# Patient Record
Sex: Male | Born: 1965 | Hispanic: Yes | Marital: Married | Smoking: Current some day smoker
Health system: Southern US, Community
[De-identification: ages and names within clinical notes are randomized; demographics above are authoritative.]

## PROBLEM LIST (undated history)

## (undated) DIAGNOSIS — K4090 Unilateral inguinal hernia, without obstruction or gangrene, not specified as recurrent: Secondary | ICD-10-CM

## (undated) HISTORY — PX: HERNIA REPAIR: SHX51

---

## 1999-02-22 HISTORY — PX: HERNIA REPAIR: SHX51

## 2010-07-21 ENCOUNTER — Emergency Department: Payer: Self-pay | Admitting: Unknown Physician Specialty

## 2014-08-22 ENCOUNTER — Emergency Department
Admission: EM | Admit: 2014-08-22 | Discharge: 2014-08-22 | Disposition: A | Discharge status: Home / Self Care | Payer: Self-pay | Attending: Emergency Medicine | Admitting: Emergency Medicine

## 2014-08-22 DIAGNOSIS — M542 Cervicalgia: Secondary | ICD-10-CM | POA: Insufficient documentation

## 2014-08-22 DIAGNOSIS — M545 Low back pain, unspecified: Secondary | ICD-10-CM

## 2014-08-22 MED ORDER — KETOROLAC TROMETHAMINE 60 MG/2ML IM SOLN
60.0000 mg | Freq: Once | INTRAMUSCULAR | Status: AC
  Administered 2014-08-22: 60 mg via INTRAMUSCULAR

## 2014-08-22 MED ORDER — DIAZEPAM 2 MG PO TABS: 2.0000 mg | ORAL_TABLET | Freq: Three times a day (TID) | ORAL | Status: AC | PRN

## 2014-08-22 MED ORDER — DIAZEPAM 2 MG PO TABS
2.0000 mg | ORAL_TABLET | Freq: Once | ORAL | Status: AC
  Administered 2014-08-22: 2 mg via ORAL

## 2014-08-22 MED ORDER — KETOROLAC TROMETHAMINE 10 MG PO TABS: 10.0000 mg | ORAL_TABLET | Freq: Four times a day (QID) | ORAL | Status: DC | PRN

## 2014-08-22 MED ORDER — KETOROLAC TROMETHAMINE 60 MG/2ML IM SOLN
INTRAMUSCULAR | Status: AC
  Administered 2014-08-22: 60 mg via INTRAMUSCULAR
  Filled 2014-08-22: qty 2

## 2014-08-22 MED ORDER — DIAZEPAM 2 MG PO TABS
ORAL_TABLET | ORAL | Status: AC
  Administered 2014-08-22: 2 mg via ORAL
  Filled 2014-08-22: qty 1

## 2014-08-22 NOTE — ED Notes (Signed)
Pt states right sided neck pain that began yesterday after waking from sleep. Pt states also has low back pain. Pt able to move neck without difficulty. Pt denies denies vomiting or fever. Pt without rash or nuchal rigidity.

## 2014-08-22 NOTE — ED Provider Notes (Signed)
[facility] Emergency Department Provider Note ____________________________________________  Time seen: Approximately 11:46 PM  I have reviewed the triage vital signs and the nursing notes.   HISTORY  Chief Complaint Neck Pain   HPI Clinton Cole is a 49 y.o. male who presents today with right sided neck pain and lower back pain that started upon awakening. He has not taken any medications to relieve the pain. He has never had this type of pain in the past. He works as a Location manager and wonders if he pulled something while on the job, but denies specific injury.  No past medical history on file.  There are no active problems to display for this patient.   No past surgical history on file.  Current Outpatient Rx  Name  Route  Sig  Dispense  Refill  . diazepam (VALIUM) 2 MG tablet   Oral   Take 1 tablet (2 mg total) by mouth every 8 (eight) hours as needed for anxiety.   30 tablet   0   . ketorolac (TORADOL) 10 MG tablet   Oral   Take 1 tablet (10 mg total) by mouth every 6 (six) hours as needed.   20 tablet   0     Allergies Review of patient's allergies indicates no known allergies.  No family history on file.  Social History History  Substance Use Topics  . Smoking status: Not on file  . Smokeless tobacco: Not on file  . Alcohol Use: Not on file    Review of Systems Constitutional: No recent illness. Eyes: No visual changes. ENT: No sore throat. Cardiovascular: Denies chest pain or palpitations. Respiratory: Denies shortness of breath. Gastrointestinal: No abdominal pain.  Genitourinary: Negative for dysuria. Musculoskeletal: Pain in right lateral neck and right lumbar area. Skin: Negative for rash. Neurological: Negative for headaches, focal weakness or numbness. 10-point ROS otherwise negative.  ____________________________________________   PHYSICAL EXAM:  VITAL SIGNS: ED Triage Vitals  Enc Vitals Group      BP 08/22/14 1914 140/83 mmHg     Pulse Rate 08/22/14 1914 85     Resp 08/22/14 1914 16     Temp 08/22/14 1914 98.4 F (36.9 C)     Temp Source 08/22/14 1914 Oral     SpO2 08/22/14 1914 100 %     Weight 08/22/14 1914 162 lb (73.483 kg)     Height 08/22/14 1914  (1.727 m)     Head Cir --      Peak Flow --      Pain Score 08/22/14 1915 8     Pain Loc --      Pain Edu? --      Excl. in GC? --     Constitutional: Alert and oriented. Well appearing and in no acute distress. Eyes: Conjunctivae are normal. EOMI. Head: Atraumatic. Nose: No congestion/rhinnorhea. Neck: No stridor.  Respiratory: Normal respiratory effort.   Musculoskeletal: Tenderness to palpation over the lateral neck and into the trapezius muscles on the right side. Tenderness to palpation of the right lower back. No midline tenderness. Full ROM of neck and back.  Neurologic:  Normal speech and language. No gross focal neurologic deficits are appreciated. Speech is normal. No gait instability. Skin:  Skin is warm, dry and intact. Atraumatic. Psychiatric: Mood and affect are normal. Speech and behavior are normal.  ____________________________________________   LABS (all labs ordered are listed, but only abnormal results are displayed)  Labs Reviewed - No data to display  ____________________________________________  RADIOLOGY  Not indicated ____________________________________________   PROCEDURES  Procedure(s) performed: None   ____________________________________________   INITIAL IMPRESSION / ASSESSMENT AND PLAN / ED COURSE  Pertinent labs & imaging results that were available during my care of the patient were reviewed by me and considered in my medical decision making (see chart for details).  IM toradol and PO valium given in the ER with partial relief of pain. Advised to follow up with PCP or return to the ER for symptoms that change or worsen.   ____________________________________________   FINAL CLINICAL IMPRESSION(S) / ED DIAGNOSES  Final diagnoses:  Musculoskeletal neck pain  Right-sided low back pain without sciatica       Chinita PesterCari B Aseneth Hack, FNP 08/22/14 2350  Loleta Roseory Forbach, MD 08/23/14 [id number]

## 2014-09-30 ENCOUNTER — Encounter: Payer: Self-pay | Admitting: Emergency Medicine

## 2014-09-30 ENCOUNTER — Emergency Department
Admission: EM | Admit: 2014-09-30 | Discharge: 2014-09-30 | Discharge status: Against Medical Advice | Payer: Self-pay | Attending: Emergency Medicine | Admitting: Emergency Medicine

## 2014-09-30 DIAGNOSIS — K469 Unspecified abdominal hernia without obstruction or gangrene: Secondary | ICD-10-CM | POA: Insufficient documentation

## 2014-09-30 DIAGNOSIS — Z720 Tobacco use: Secondary | ICD-10-CM | POA: Insufficient documentation

## 2014-09-30 NOTE — ED Notes (Signed)
Pt stepped to desk and states they have to leave and take his wife to work.  Pt states he may come back later today if he needs to.

## 2014-09-30 NOTE — ED Notes (Signed)
Per interpreter: Pt c/o left groin pain after lifting heavy objects at work. Pt with hx of hernia on left side.

## 2015-05-18 ENCOUNTER — Encounter: Payer: Self-pay | Admitting: Medical Oncology

## 2015-05-18 ENCOUNTER — Emergency Department
Admission: EM | Admit: 2015-05-18 | Discharge: 2015-05-18 | Disposition: A | Discharge status: Home / Self Care | Payer: Self-pay | Attending: Emergency Medicine | Admitting: Emergency Medicine

## 2015-05-18 DIAGNOSIS — R52 Pain, unspecified: Secondary | ICD-10-CM | POA: Insufficient documentation

## 2015-05-18 DIAGNOSIS — Z79899 Other long term (current) drug therapy: Secondary | ICD-10-CM | POA: Insufficient documentation

## 2015-05-18 DIAGNOSIS — J069 Acute upper respiratory infection, unspecified: Secondary | ICD-10-CM

## 2015-05-18 DIAGNOSIS — F17200 Nicotine dependence, unspecified, uncomplicated: Secondary | ICD-10-CM | POA: Insufficient documentation

## 2015-05-18 LAB — RAPID INFLUENZA A&B ANTIGENS: Influenza A (ARMC): NEGATIVE

## 2015-05-18 LAB — RAPID INFLUENZA A&B ANTIGENS (ARMC ONLY): INFLUENZA B (ARMC): NEGATIVE

## 2015-05-18 MED ORDER — GUAIFENESIN-CODEINE 100-10 MG/5ML PO SOLN: 5.0000 mL | ORAL | Status: DC | PRN

## 2015-05-18 MED ORDER — ACETAMINOPHEN 500 MG PO TABS
1000.0000 mg | ORAL_TABLET | Freq: Once | ORAL | Status: AC
  Administered 2015-05-18: 1000 mg via ORAL
  Filled 2015-05-18: qty 2

## 2015-05-18 NOTE — ED Provider Notes (Signed)
[facility] Emergency Department Provider Note  ____________________________________________  Time seen: Approximately 10:58 AM  I have reviewed the triage vital signs and the nursing notes.   HISTORY  Chief Complaint Fever and Generalized Body Aches   HPI Clinton Cole is a 50 y.o. male is here with complaint of fever, headache, body aches for 2-3 days. Patient has taken over-the-counter medication without much relief. He is uncertain as to how much fever he has had a home. He denies any coughing at this time. He denies any nausea, vomiting, diarrhea.Patient currently is complaining of chills. He did not get flu shot this year. Currently he rates his pain as a 9/10.   History reviewed. No pertinent past medical history.  There are no active problems to display for this patient.   Past Surgical History  Procedure Laterality Date  . Hernia repair      Current Outpatient Rx  Name  Route  Sig  Dispense  Refill  . diazepam (VALIUM) 2 MG tablet   Oral   Take 1 tablet (2 mg total) by mouth every 8 (eight) hours as needed for anxiety.   30 tablet   0   . guaiFENesin-codeine 100-10 MG/5ML syrup   Oral   Take 5 mLs by mouth every 4 (four) hours as needed.   120 mL   0   . ketorolac (TORADOL) 10 MG tablet   Oral   Take 1 tablet (10 mg total) by mouth every 6 (six) hours as needed.   20 tablet   0     Allergies Review of patient's allergies indicates no known allergies.  No family history on file.  Social History Social History  Substance Use Topics  . Smoking status: Current Every Day Smoker  . Smokeless tobacco: None  . Alcohol Use: No    Review of Systems Constitutional: Positive fever/chills Eyes: No visual changes. ENT: No sore throat. Cardiovascular: Denies chest pain. Respiratory: Denies shortness of breath.  Gastrointestinal: No abdominal pain.  No nausea, no vomiting.  No diarrhea.  Genitourinary: Negative for  dysuria. Musculoskeletal: Also generalized body aches. Skin: Negative for rash. Neurological: Positive for headaches, no focal weakness or numbness.  10-point ROS otherwise negative.  ____________________________________________   PHYSICAL EXAM:  VITAL SIGNS: ED Triage Vitals  Enc Vitals Group     BP 05/18/15 1032 122/71 mmHg     Pulse Rate 05/18/15 1032 100     Resp 05/18/15 1032 18     Temp 05/18/15 1032 99.4 F (37.4 C)     Temp Source 05/18/15 1032 Oral     SpO2 05/18/15 1032 98 %     Weight 05/18/15 1032 160 lb (72.576 kg)     Height 05/18/15 1032 5' 4.57" (1.64 m)     Head Cir --      Peak Flow --      Pain Score 05/18/15 1032 9     Pain Loc --      Pain Edu? --      Excl. in GC? --     Constitutional: Alert and oriented. Well appearing and in no acute distress. Eyes: Conjunctivae are normal. PERRL. EOMI. Head: Atraumatic. Nose: No congestion/rhinnorhea.EACs and TMs are clear bilaterally. Mouth/Throat: Mucous membranes are moist.  Oropharynx non-erythematous. Positive posterior drainage. Neck: No stridor.   Hematological/Lymphatic/Immunilogical: No cervical lymphadenopathy. Cardiovascular: Normal rate, regular rhythm. Grossly normal heart sounds.  Good peripheral circulation. Respiratory: Normal respiratory effort.  No retractions. Lungs CTAB. Gastrointestinal: Soft and nontender.  No distention.  Musculoskeletal: Moves upper and lower extremities without any difficulty. Normal gait was noted. Neurologic:  Normal speech and language. No gross focal neurologic deficits are appreciated. No gait instability. Skin:  Skin is warm, dry and intact. No rash noted. Psychiatric: Mood and affect are normal. Speech and behavior are normal.  ____________________________________________   LABS (all labs ordered are listed, but only abnormal results are displayed)  Labs Reviewed  RAPID INFLUENZA A&B ANTIGENS ([facility] ONLY)    PROCEDURES  Procedure(s) performed:  None  Critical Care performed: No  ____________________________________________   INITIAL IMPRESSION / ASSESSMENT AND PLAN / ED COURSE  Pertinent labs & imaging results that were available during my care of the patient were reviewed by me and considered in my medical decision making (see chart for details).  Information was verified with the Spanish interpreter Annice Pih of present. Patient is aware that he is getting a cough syrup with codeine and that his influenza  test is negative. He was given a note for work. He is to increase fluids and take Tylenol or ibuprofen as needed for fever. Follow-up with [facility] if any continued problems. ____________________________________________   FINAL CLINICAL IMPRESSION(S) / ED DIAGNOSES  Final diagnoses:  Acute upper respiratory infection      Tommi Rumps, PA-C 05/18/15 1318  Sharman Cheek, MD 05/18/15 1553

## 2015-05-18 NOTE — Discharge Instructions (Signed)
Infeccin del tracto respiratorio superior, adultos (Upper Respiratory Infection, Adult) La mayora de las infecciones del tracto respiratorio superior estn causadas por un virus. Un infeccin del tracto respiratorio superior afecta la nariz, la garganta y las vas respiratorias superiores. El tipo ms comn de infeccin del tracto respiratorio superior es el resfro comn. CUIDADOS EN EL HOGAR   Tome los medicamentos solamente como se lo haya indicado el mdico.  A fin de aliviar el dolor de garganta, haga grgaras con solucin salina templada o consuma caramelos para la tos, como se lo haya indicado el mdico.  Use un humidificador de vapor clido o inhale el vapor de la ducha para aumentar la humedad del aire. Esto facilitar la respiracin.  Beba suficiente lquido para mantener el pis (orina) claro o de color amarillo plido.  Tome sopas y caldos transparentes.  Siga una dieta saludable.  Descanse todo lo que sea necesario.  Regrese al trabajo cuando la fiebre haya desaparecido o el mdico le diga que puede hacerlo.  Es posible que deba quedarse en su casa durante un tiempo prolongado, para no transmitir la infeccin a los dems.  Tambin puede usar un barbijo y lavarse las manos con frecuencia para evitar el contagio del virus.  Si tiene asma, use el inhalador con mayor frecuencia.  No consuma ningn producto que contenga tabaco, lo que incluye cigarrillos, tabaco de mascar o cigarrillos electrnicos. Si necesita ayuda para dejar de fumar, consulte al mdico. SOLICITE AYUDA SI:  Siente que empeora o que no mejora.  Los medicamentos no logran aliviar los sntomas.  Tiene escalofros.  La dificultad para respirar es peor.  Tiene mucosidad marrn o roja.  Tiene una secrecin amarilla o marrn de la nariz.  Le duele la cara, especialmente al inclinarse hacia adelante.  Tiene fiebre.  Tiene los ganglios del cuello hinchados.  Siente dolor al tragar.  Tiene zonas  blancas en la parte de atrs de la garganta. SOLICITE AYUDA DE INMEDIATO SI:   Los siguientes sntomas son muy intensos o constantes:  Dolor de cabeza.  Dolor de odos.  Dolor en la frente, detrs de los ojos y por encima de los pmulos (dolor sinusal).  Dolor en el pecho.  Tiene enfermedad pulmonar prolongada (crnica) y cualquiera de estos sntomas:  Sibilancias.  Tos prolongada.  Tos con sangre.  Cambio en la mucosidad habitual.  Presenta rigidez en el cuello.  Tiene cambios en:  La visin.  La audicin.  El pensamiento.  El estado de nimo. ASEGRESE DE QUE:   Comprende estas instrucciones.  Controlar su afeccin.  Recibir ayuda de inmediato si no mejora o si empeora.   Esta informacin no tiene como fin reemplazar el consejo del mdico. Asegrese de hacerle al mdico cualquier pregunta que tenga.   Document Released: 07/12/2010 Document Revised: 06/24/2014 Elsevier Interactive Patient Education 2016 Elsevier Inc.  

## 2015-05-18 NOTE — ED Notes (Signed)
Fever headache and body aches since Saturday  No cough

## 2015-05-18 NOTE — ED Notes (Signed)
Pt reports fever and body aches since Saturday.

## 2018-01-30 ENCOUNTER — Emergency Department
Admission: EM | Admit: 2018-01-30 | Discharge: 2018-01-30 | Disposition: A | Discharge status: Home / Self Care | Payer: Worker's Compensation | Attending: Emergency Medicine | Admitting: Emergency Medicine

## 2018-01-30 ENCOUNTER — Other Ambulatory Visit: Payer: Self-pay

## 2018-01-30 ENCOUNTER — Encounter: Payer: Self-pay | Admitting: Emergency Medicine

## 2018-01-30 ENCOUNTER — Emergency Department: Payer: Worker's Compensation

## 2018-01-30 DIAGNOSIS — Y9389 Activity, other specified: Secondary | ICD-10-CM | POA: Insufficient documentation

## 2018-01-30 DIAGNOSIS — W228XXA Striking against or struck by other objects, initial encounter: Secondary | ICD-10-CM | POA: Insufficient documentation

## 2018-01-30 DIAGNOSIS — S0083XA Contusion of other part of head, initial encounter: Secondary | ICD-10-CM | POA: Diagnosis not present

## 2018-01-30 DIAGNOSIS — Y999 Unspecified external cause status: Secondary | ICD-10-CM | POA: Diagnosis not present

## 2018-01-30 DIAGNOSIS — H5712 Ocular pain, left eye: Secondary | ICD-10-CM | POA: Insufficient documentation

## 2018-01-30 DIAGNOSIS — S0990XA Unspecified injury of head, initial encounter: Secondary | ICD-10-CM | POA: Diagnosis present

## 2018-01-30 DIAGNOSIS — F17200 Nicotine dependence, unspecified, uncomplicated: Secondary | ICD-10-CM | POA: Diagnosis not present

## 2018-01-30 DIAGNOSIS — Y92512 Supermarket, store or market as the place of occurrence of the external cause: Secondary | ICD-10-CM | POA: Diagnosis not present

## 2018-01-30 MED ORDER — ACETAMINOPHEN 325 MG PO TABS
650.0000 mg | ORAL_TABLET | Freq: Once | ORAL | Status: AC
  Administered 2018-01-30: 650 mg via ORAL
  Filled 2018-01-30: qty 2

## 2018-01-30 MED ORDER — FLUORESCEIN SODIUM 1 MG OP STRP
1.0000 | ORAL_STRIP | Freq: Once | OPHTHALMIC | Status: AC
  Administered 2018-01-30: 1 via OPHTHALMIC

## 2018-01-30 MED ORDER — TETRACAINE HCL 0.5 % OP SOLN
OPHTHALMIC | Status: AC
  Administered 2018-01-30: 1 [drp] via OPHTHALMIC
  Filled 2018-01-30: qty 4

## 2018-01-30 MED ORDER — EYE WASH OPHTH SOLN
1.0000 [drp] | OPHTHALMIC | Status: DC | PRN
  Administered 2018-01-30: 1 [drp] via OPHTHALMIC

## 2018-01-30 MED ORDER — FLUORESCEIN SODIUM 1 MG OP STRP
ORAL_STRIP | OPHTHALMIC | Status: AC
  Administered 2018-01-30: 1 via OPHTHALMIC
  Filled 2018-01-30: qty 1

## 2018-01-30 MED ORDER — TETRACAINE HCL 0.5 % OP SOLN
1.0000 [drp] | Freq: Once | OPHTHALMIC | Status: AC
  Administered 2018-01-30: 1 [drp] via OPHTHALMIC

## 2018-01-30 MED ORDER — IBUPROFEN 600 MG PO TABS: 600.0000 mg | ORAL_TABLET | Freq: Three times a day (TID) | ORAL | 0 refills | Status: AC | PRN

## 2018-01-30 MED ORDER — EYE WASH OPHTH SOLN
OPHTHALMIC | Status: AC
  Administered 2018-01-30: 1 [drp] via OPHTHALMIC
  Filled 2018-01-30: qty 118

## 2018-01-30 NOTE — ED Triage Notes (Signed)
Patient ambulatory to triage with steady gait, without difficulty or distress noted; pt employed with [employer] accomp by supervisor; reports bent over to pick pots up off the floor and hit head on metal chute; c/o HA and difficulty seeing out of left eye; denies LOC; area of redness noted to left side forehead

## 2018-01-30 NOTE — ED Notes (Addendum)
Workmans comp UDS completed with the assistance of interpreter services.

## 2018-01-30 NOTE — Discharge Instructions (Signed)
Follow-up with Dr. Sharman CrateBrassington if any continued problems with your left eye. Take ibuprofen every 8 hours with food if needed for headache or facial pain.  You may use ice to your face as needed for discomfort.  Take your work note with you to work.

## 2018-01-30 NOTE — ED Notes (Signed)
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

## 2018-01-30 NOTE — ED Notes (Signed)
Visual Acuity: Bilateral 20/40 Right eye 20/40 Left eye 20/50

## 2018-01-30 NOTE — ED Provider Notes (Signed)
[facility] Emergency Department Provider Note   ____________________________________________   First MD Initiated Contact with Patient 01/30/18 [id number]     (approximate)  I have reviewed the triage vital signs and the nursing notes.   HISTORY Patient and VIR  Chief Complaint Head Injury   HPI Clinton Cole is a [age] y.o. male presents to the ED with complaint of hitting his left forehead on a metal shoot while at work at [employer].  Patient was brought to the ED by his supervisor.  He reports that he was bending over to pick up pots off the floor when he hit his head on the metal shoot.  No history of loss of consciousness.  Patient complains of a headache and difficulty seeing out of his left eye.  He rates his pain as 9/10.   History reviewed. No pertinent past medical history.  There are no active problems to display for this patient.   Past Surgical History:  Procedure Laterality Date  . HERNIA REPAIR      Prior to Admission medications   Medication Sig Start Date End Date Taking? Authorizing Provider  ibuprofen (ADVIL,MOTRIN) 600 MG tablet Take 1 tablet (600 mg total) by mouth every 8 (eight) hours as needed. 01/30/18   Tommi RumpsSummers, Rhonda L, PA-C    Allergies Patient has no known allergies.  No family history on file.  Social History Social History   Tobacco Use  . Smoking status: Current Every Day Smoker  . Smokeless tobacco: Never Used  Substance Use Topics  . Alcohol use: No  . Drug use: Not on file    Review of Systems Constitutional: No fever/chills Eyes: Left eye pain. ENT: No sore throat. Cardiovascular: Denies chest pain. Respiratory: Denies shortness of breath. Gastrointestinal: No abdominal pain.  No nausea, no vomiting. Musculoskeletal: Negative for muscle skeletal pain. Skin: Positive for erythema left forehead. Neurological: Negative for headaches, focal weakness or  numbness. ___________________________________________   PHYSICAL EXAM:  VITAL SIGNS: ED Triage Vitals  Enc Vitals Group     BP 01/30/18 0108 (!) 141/88     Pulse Rate 01/30/18 0108 76     Resp 01/30/18 0108 16     Temp 01/30/18 0108 97.9 F (36.6 C)     Temp Source 01/30/18 0108 Oral     SpO2 01/30/18 0108 97 %     Weight 01/30/18 0106 153 lb (69.4 kg)     Height 01/30/18 0106 5' 8.11" (1.73 m)     Head Circumference --      Peak Flow --      Pain Score 01/30/18 0106 9     Pain Loc --      Pain Edu? --      Excl. in GC? --    Constitutional: Alert and oriented. Well appearing and in no acute distress. Eyes: Conjunctivae are normal. PERRL. EOMI. tetracaine was placed in the left eye and afterwards there was a exam to look for any foreign bodies.  Lid was everted and no foreign body was present.  Fluorescein stain was placed and no corneal abrasion is noted.  Foreseen was then flushed with eyewash and patient tolerated this well. Head: Atraumatic. Nose: No trauma. Neck: No stridor.  Nontender cervical spine to palpation posteriorly. Cardiovascular: Normal rate, regular rhythm. Grossly normal heart sounds.  Good peripheral circulation. Respiratory: Normal respiratory effort.  No retractions. Lungs CTAB. Musculoskeletal: Moves upper and lower extremities without any difficulty.  Normal gait was noted. Neurologic:  Normal speech and language. No gross focal neurologic deficits are appreciated.  Cranial nerves II through XII grossly intact.  No gait instability. Skin:  Skin is warm, dry and intact.  There is an erythematous area that is localized to the left forehead just above the left eyebrow however skin is intact and there is no evidence of bleeding.  Soft tissue tenderness to palpation. Psychiatric: Mood and affect are normal. Speech and behavior are normal.  ____________________________________________   LABS (all labs ordered are listed, but only abnormal results are  displayed)  Labs Reviewed - No data to display  RADIOLOGY  Official radiology report(s): Ct Head Wo Contrast  Result Date: 01/30/2018 CLINICAL DATA:  Headache and left visual disturbance after hitting his head on a metal chute. Left forehead redness. EXAM: CT HEAD WITHOUT CONTRAST TECHNIQUE: Contiguous axial images were obtained from the base of the skull through the vertex without intravenous contrast. COMPARISON:  None. FINDINGS: Brain: CSF filled cleft in the right frontal lobe extending to the frontal horn of the left lateral ventricle with some tenting of the ventricle toward the cleft. There's also a patchy low density in the adjacent right frontal white matter. The ventricles are normal in size. No intracranial hemorrhage, mass lesion or CT evidence of acute infarction. Vascular: No hyperdense vessel or unexpected calcification. Skull: Normal. Negative for fracture or focal lesion. Sinuses/Orbits: Mild bilateral ethmoid, inferior frontal and anterior sphenoid sinus mucosal thickening. Other: None. IMPRESSION: 1. No acute abnormality. 2. 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 3. Mild chronic bilateral ethmoid, inferior frontal and anterior sphenoid sinusitis. Electronically Signed   By: Beckie Salts M.D.   On: 01/30/2018 01:54  ____________________________________________   PROCEDURES  Procedure(s) performed: None  Procedures  Critical Care performed: No  ____________________________________________   INITIAL IMPRESSION / ASSESSMENT AND PLAN / ED COURSE  As part of my medical decision making, I reviewed the following data within the electronic MEDICAL RECORD NUMBER Notes from prior ED visits and [place] Controlled Substance Database  Patient presents to the ED for a Workmen's Comp. injury in which he hit his forehead on a metal shoot.  There was no loss of consciousness and patient has continued to be ambulatory without any  assistance.  He does complain of a headache and also difficulty seeing out of his left eye.  Visual acuity was checked and documented.  Physical exam showed soft tissue injury above the left eye on the forehead but no open lesions.  CT scan confirmed no acute changes and patient was made aware through the VIR.  It was discussed that Workmen's Comp. 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  Patient was discharged with prescription for ibuprofen 600 mg every 8 hours with food as needed.  ____________________________________________   FINAL CLINICAL IMPRESSION(S) / ED DIAGNOSES  Final diagnoses:  Contusion of forehead, initial encounter  Eye pain, left     ED Discharge Orders         Ordered    ibuprofen (ADVIL,MOTRIN) 600 MG tablet  Every 8 hours PRN     01/30/18 0830           Note:  This document was prepared using Dragon voice recognition software and may include unintentional dictation errors.    Tommi Rumps, PA-C 01/30/18 1447    Emily Filbert, MD 01/30/18 [phone]

## 2018-10-13 ENCOUNTER — Emergency Department: Payer: Self-pay

## 2018-10-13 ENCOUNTER — Other Ambulatory Visit: Payer: Self-pay

## 2018-10-13 ENCOUNTER — Emergency Department
Admission: EM | Admit: 2018-10-13 | Discharge: 2018-10-13 | Disposition: A | Discharge status: Home / Self Care | Payer: Self-pay | Attending: Emergency Medicine | Admitting: Emergency Medicine

## 2018-10-13 DIAGNOSIS — K4090 Unilateral inguinal hernia, without obstruction or gangrene, not specified as recurrent: Secondary | ICD-10-CM | POA: Insufficient documentation

## 2018-10-13 DIAGNOSIS — R1909 Other intra-abdominal and pelvic swelling, mass and lump: Secondary | ICD-10-CM

## 2018-10-13 DIAGNOSIS — F17200 Nicotine dependence, unspecified, uncomplicated: Secondary | ICD-10-CM | POA: Insufficient documentation

## 2018-10-13 LAB — CBC
HCT: 48.9 % (ref 39.0–52.0)
Hemoglobin: 16.2 g/dL (ref 13.0–17.0)
MCH: 30.3 pg (ref 26.0–34.0)
MCHC: 33.1 g/dL (ref 30.0–36.0)
MCV: 91.4 fL (ref 80.0–100.0)
Platelets: 256 10*3/uL (ref 150–400)
RBC: 5.35 MIL/uL (ref 4.22–5.81)
RDW: 12.8 % (ref 11.5–15.5)
WBC: 12.9 10*3/uL — ABNORMAL HIGH (ref 4.0–10.5)
nRBC: 0 % (ref 0.0–0.2)

## 2018-10-13 LAB — COMPREHENSIVE METABOLIC PANEL
ALT: 46 U/L — ABNORMAL HIGH (ref 0–44)
AST: 30 U/L (ref 15–41)
Albumin: 3.9 g/dL (ref 3.5–5.0)
Alkaline Phosphatase: 115 U/L (ref 38–126)
Anion gap: 8 (ref 5–15)
BUN: 12 mg/dL (ref 6–20)
CO2: 22 mmol/L (ref 22–32)
Calcium: 8.5 mg/dL — ABNORMAL LOW (ref 8.9–10.3)
Chloride: 107 mmol/L (ref 98–111)
Creatinine, Ser: 0.6 mg/dL — ABNORMAL LOW (ref 0.61–1.24)
GFR calc Af Amer: >60 mL/min (ref >60)
GFR calc non Af Amer: >60 mL/min (ref >60)
Glucose, Bld: 163 mg/dL — ABNORMAL HIGH (ref 70–99)
Potassium: 3.7 mmol/L (ref 3.5–5.1)
Sodium: 137 mmol/L (ref 135–145)
Total Bilirubin: 0.7 mg/dL (ref 0.3–1.2)
Total Protein: 7.2 g/dL (ref 6.5–8.1)

## 2018-10-13 LAB — LIPASE, BLOOD: Lipase: 25 U/L (ref 11–51)

## 2018-10-13 MED ORDER — IOHEXOL 300 MG/ML  SOLN
100.0000 mL | Freq: Once | INTRAMUSCULAR | Status: AC | PRN
  Administered 2018-10-13: 100 mL via INTRAVENOUS
  Filled 2018-10-13: qty 100

## 2018-10-13 MED ORDER — IOHEXOL 240 MG/ML SOLN
50.0000 mL | Freq: Once | INTRAMUSCULAR | Status: AC | PRN
  Administered 2018-10-13: 50 mL via ORAL
  Filled 2018-10-13: qty 50

## 2018-10-13 NOTE — ED Triage Notes (Signed)
Pt presents via POV c/o hernia that is more painful and larger in groin area. Not visualized at this moment. Sent from MD.

## 2018-10-13 NOTE — Discharge Instructions (Addendum)
You were seen today for left inguinal hernia.  CT of the abdomen pelvis does not show strangulation.    We would like for you to follow-up with general surgery as an outpatient.  Please call and schedule an appointment.

## 2018-10-13 NOTE — ED Provider Notes (Signed)
[facility] Emergency Department Provider Note ____________________________________________  Time seen: 1500  I have reviewed the triage vital signs and the nursing notes.  HISTORY  Chief Complaint  Hernia   HPI Clinton Cole is a 53 y.o. male presents to the ER today with complaint of painful left groin mass.  He reports this has been present for 3 years.  He reports it has gotten larger and more painful over time.  He denies fever, chills, nausea, vomiting or body aches.  He denies constipation, diarrhea or blood in his stool.  He denies urinary retention or blood in urine.  He has not taken anything over-the-counter for this.  He has had hernia surgery in the past.  History reviewed. No pertinent past medical history.  There are no active problems to display for this patient.   Past Surgical History:  Procedure Laterality Date  . HERNIA REPAIR      Prior to Admission medications   Medication Sig Start Date End Date Taking? Authorizing Provider  ibuprofen (ADVIL,MOTRIN) 600 MG tablet Take 1 tablet (600 mg total) by mouth every 8 (eight) hours as needed. 01/30/18   Johnn Hai, PA-C    Allergies Patient has no known allergies.  History reviewed. No pertinent family history.  Social History Social History   Tobacco Use  . Smoking status: Current Every Day Smoker  . Smokeless tobacco: Never Used  Substance Use Topics  . Alcohol use: No  . Drug use: Not on file    Review of Systems  Constitutional: Negative for fever, chills or body aches. ECardiovascular: Negative for chest pain or chest tightness. Respiratory: Negative for cough or shortness of breath. Gastrointestinal: Negative for abdominal pain, nausea, vomiting, constipation, diarrhea or blood in his stool. Genitourinary: Negative for urinary retention or blood in the urine.  ____________________________________________  PHYSICAL EXAM:  VITAL SIGNS: ED Triage Vitals   Enc Vitals Group     BP 10/13/18 1252 (!) 142/90     Pulse Rate 10/13/18 1252 91     Resp 10/13/18 1252 14     Temp 10/13/18 1252 98.6 F (37 C)     Temp Source 10/13/18 1252 Oral     SpO2 10/13/18 1252 95 %     Weight 10/13/18 1253 170 lb (77.1 kg)     Height --      Head Circumference --      Peak Flow --      Pain Score --      Pain Loc --      Pain Edu? --      Excl. in [place]? --     Constitutional: Alert and oriented. Well appearing and in no distress. Cardiovascular: Normal rate, regular rhythm.  Respiratory: Normal respiratory effort. No wheezes/rales/rhonchi. Gastrointestinal: Soft and nontender.  Active bowel sounds.  No distention.  Neurologic:  Normal speech and language.  Skin: Large direct inguinal hernia noted in the left groin.  Unable to be reduced.  ____________________________________________   LABS   Recent Results (from the past 2160 hour(s))  Lipase, blood     Status: None   Collection Time: 10/13/18  1:02 PM  Result Value Ref Range   Lipase 25 11 - 51 U/L    Comment: Performed at [facility], [address] [place], [place] [address]  Comprehensive metabolic panel     Status: Abnormal   Collection Time: 10/13/18  1:02 PM  Result Value Ref Range   Sodium 137 135 - 145  mmol/L   Potassium 3.7 3.5 - 5.1 mmol/L   Chloride 107 98 - 111 mmol/L   CO2 22 22 - 32 mmol/L   Glucose, Bld 163 (H) 70 - 99 mg/dL   BUN 12 6 - 20 mg/dL   Creatinine, Ser 4.780.60 (L) 0.61 - 1.24 mg/dL   Calcium 8.5 (L) 8.9 - 10.3 mg/dL   Total Protein 7.2 6.5 - 8.1 g/dL   Albumin 3.9 3.5 - 5.0 g/dL   AST 30 15 - 41 U/L   ALT 46 (H) 0 - 44 U/L   Alkaline Phosphatase 115 38 - 126 U/L   Total Bilirubin 0.7 0.3 - 1.2 mg/dL   GFR calc non Af Amer >60 >60 mL/min   GFR calc Af Amer >60 >60 mL/min   Anion gap 8 5 - 15    Comment: Performed at [facility], [address] [place], [place] [address]  CBC     Status: Abnormal   Collection Time: 10/13/18  1:02 PM   Result Value Ref Range   WBC 12.9 (H) 4.0 - 10.5 K/uL   RBC 5.35 4.22 - 5.81 MIL/uL   Hemoglobin 16.2 13.0 - 17.0 g/dL   HCT 13.048.9 86.539.0 - 78.452.0 %   MCV 91.4 80.0 - 100.0 fL   MCH 30.3 26.0 - 34.0 pg   MCHC 33.1 30.0 - 36.0 g/dL   RDW 69.612.8 29.511.5 - 28.415.5 %   Platelets 256 150 - 400 K/uL   nRBC 0.0 0.0 - 0.2 %    Comment: Performed at [facility], [address] [place], [place] [address]    ____________________________________________ RADIOLOGY   Imaging Orders     US LT LOWER EXTREM LTD SOFT TISSUE NON VASCULAR     CT ABDOMEN PELVIS W CONTRAST IMPRESSION:  Palpable abnormality is favored to reflect a left inguinal hernia  containing bowel and fluid, although poorly evaluated ultrasound.    Consider CT pelvis with contrast for further evaluation, as  clinically warranted.    CT ABDOMEN/PELVIS WITH CONTRAST: IMPRESSION:  Large left inguinal/scrotal hernia containing fat with fluid and  inflammatory stranding, corresponding to the sonographic  abnormality. No associated bowel within the hernia.    Additional tiny fat containing right inguinal hernia.    No evidence of bowel obstruction. Normal appendix.   ____________________________________________  INITIAL IMPRESSION / ASSESSMENT AND PLAN / ED COURSE  Left Inguinal Hernia:  Not strangulated by imaging He did not want to try reduction in the ER Follow up with General Surgery as an outpatient  ____________________________________________  FINAL CLINICAL IMPRESSION(S) / ED DIAGNOSES  Final diagnoses:  Left groin mass  Left inguinal hernia   Nicki Reaperegina Haskell Rihn, NP    Lorre MunroeBaity, Lumen Brinlee W, NP 10/13/18 1810    Dionne BucySiadecki, Sebastian, MD 10/14/18 [phone]

## 2018-10-23 ENCOUNTER — Ambulatory Visit: Payer: Self-pay | Admitting: General Surgery

## 2018-10-23 NOTE — H&P (View-Only) (Signed)
PATIENT PROFILE: Clinton Cole is a 53 y.o. male who presents to the Clinic for consultation at the request of Dr. Marisa SeverinSiadecki for evaluation of left inguinal hernia.  PCP:  None  HISTORY OF PRESENT ILLNESS: Mr. Clinton Cole reports having a left knee hernia since 2 years ago.  He reports of the last week he felt that he got bigger while he was lifting something heavy at work.  Since then he has been painful.  He denies nausea or vomiting.  Pain radiates to the left testicle.  There is no alleviating or aggravating factor.  He went to the ED and they did a CT scan where he was found to be omental fat and no bowel in the hernia.  I personally evaluated the images.  There is no sign of obstruction or concerning for ischemia.   PROBLEM LIST: Recurrent left inguinal hernia, incarcerated  GENERAL REVIEW OF SYSTEMS:   General ROS: negative for - chills, fatigue, fever, weight gain or weight loss Allergy and Immunology ROS: negative for - hives  Hematological and Lymphatic ROS: negative for - bleeding problems or bruising, negative for palpable nodes Endocrine ROS: negative for - heat or cold intolerance, hair changes Respiratory ROS: negative for - cough, shortness of breath or wheezing Cardiovascular ROS: no chest pain or palpitations GI ROS: negative for nausea, vomiting, abdominal pain, diarrhea, constipation Musculoskeletal ROS: negative for - joint swelling or muscle pain Neurological ROS: negative for - confusion, syncope Dermatological ROS: negative for pruritus and rash Psychiatric: negative for anxiety, depression, difficulty sleeping and memory loss  MEDICATIONS: Current Medications        Current Outpatient Medications  Medication Sig Dispense Refill  . ibuprofen (MOTRIN) 200 MG tablet Take by mouth as needed        No current facility-administered medications for this visit.       ALLERGIES: Patient has no known allergies.  PAST MEDICAL HISTORY: History reviewed. No  pertinent past medical history.  PAST SURGICAL HISTORY:      Past Surgical History:  Procedure Laterality Date  . INGUINAL HERNIA REPAIR Left 2003   [place]     FAMILY HISTORY:      Family History  Problem Relation Age of Onset  . No Known Problems Mother   . No Known Problems Father   . No Known Problems Sister   . Diabetes Brother   . Diabetes Brother   . Diabetes Brother   . No Known Problems Brother   . No Known Problems Sister   . No Known Problems Sister      SOCIAL HISTORY: Social History          Socioeconomic History  . Marital status: Unknown    Spouse name: Not on file  . Number of children: Not on file  . Years of education: Not on file  . Highest education level: Not on file  Occupational History  . Not on file  Social Needs  . Financial resource strain: Not on file  . Food insecurity    Worry: Not on file    Inability: Not on file  . Transportation needs    Medical: Not on file    Non-medical: Not on file  Tobacco Use  . Smoking status: Former Games developermoker  . Smokeless tobacco: Never Used  Substance and Sexual Activity  . Alcohol use: Never    Frequency: Never    Comment: quit 2010  . Drug use: Never  . Sexual activity: Not on file  Other Topics Concern  . Not on file  Social History Narrative  . Not on file      PHYSICAL EXAM:    Vitals:   10/23/18 1558  BP: 135/87  Pulse: 97   Body mass index is 25.7 kg/m. Weight: 76.7 kg (169 lb)   GENERAL: Alert, active, oriented x3  HEENT: Pupils equal reactive to light. Extraocular movements are intact. Sclera clear. Palpebral conjunctiva normal red color.Pharynx clear.  NECK: Supple with no palpable mass and no adenopathy.  LUNGS: Sound clear with no rales rhonchi or wheezes.  HEART: Regular rhythm S1 and S2 without murmur.  ABDOMEN: Soft and depressible, nontender with no palpable mass, no hepatomegaly.  Large left inguinal hernia,  incarcerated, moderately tender to palpation.  There is no skin changes.  There is no sign of strangulation.  EXTREMITIES: Well-developed well-nourished symmetrical with no dependent edema.  NEUROLOGICAL: Awake alert oriented, facial expression symmetrical, moving all extremities.  REVIEW OF DATA: I have reviewed the following data today: No results found for any previous visit.     ASSESSMENT: Mr. Clinton Cole is a 53 y.o. male presenting for consultation for left recurrent incarcerated inguinal hernia.    The patient presents with a symptomatic, recurrent, incarcerated omentum inguinal hernia. Patient was oriented about the diagnosis of recurrent inguinal hernia and its implication. The patient was oriented about the treatment alternatives (observation vs surgical repair). Due to patient symptoms, repair is recommended. Patient oriented about the surgical procedure, the use of mesh and its risk of complications such as: infection, bleeding, injury to vas deference, vasculature and testicle, injury to bowel or bladder, and chronic pain.  Since patient has inguinal hernia repairs x2 on the same side I discussed with him the recommendation of starting with laparoscopic approach.  Due to the size and the amount of tissue incarcerated in the hernia I disclosed that it might be difficult to complete the surgery laparoscopically and open approach might be needed.  He understood and agreed to proceed.  Unilateral recurrent inguinal hernia without obstruction or gangrene [K40.91]  PLAN: 1. Laparoscopic vs open recurrent incarcerated inguinal hernia repair with mesh (24097, 35329) 2. Avoid heavy lifting until surgery next week.  3. Do not take aspirin  Patient verbalized understanding, all questions were answered, and were agreeable with the plan outlined above.    Herbert Pun, MD  Electronically signed by Herbert Pun, MD

## 2018-10-23 NOTE — H&P (Signed)
PATIENT PROFILE: Clinton Cole is a 53 y.o. male who presents to the Clinic for consultation at the request of Dr. Siadecki for evaluation of left inguinal hernia.  PCP:  None  HISTORY OF PRESENT ILLNESS: Clinton Cole reports having a left knee hernia since 2 years ago.  He reports of the last week he felt that he got bigger while he was lifting something heavy at work.  Since then he has been painful.  He denies nausea or vomiting.  Pain radiates to the left testicle.  There is no alleviating or aggravating factor.  He went to the ED and they did a CT scan where he was found to be omental fat and no bowel in the hernia.  I personally evaluated the images.  There is no sign of obstruction or concerning for ischemia.   PROBLEM LIST: Recurrent left inguinal hernia, incarcerated  GENERAL REVIEW OF SYSTEMS:   General ROS: negative for - chills, fatigue, fever, weight gain or weight loss Allergy and Immunology ROS: negative for - hives  Hematological and Lymphatic ROS: negative for - bleeding problems or bruising, negative for palpable nodes Endocrine ROS: negative for - heat or cold intolerance, hair changes Respiratory ROS: negative for - cough, shortness of breath or wheezing Cardiovascular ROS: no chest pain or palpitations GI ROS: negative for nausea, vomiting, abdominal pain, diarrhea, constipation Musculoskeletal ROS: negative for - joint swelling or muscle pain Neurological ROS: negative for - confusion, syncope Dermatological ROS: negative for pruritus and rash Psychiatric: negative for anxiety, depression, difficulty sleeping and memory loss  MEDICATIONS: Current Medications        Current Outpatient Medications  Medication Sig Dispense Refill  . ibuprofen (MOTRIN) 200 MG tablet Take by mouth as needed        No current facility-administered medications for this visit.       ALLERGIES: Patient has no known allergies.  PAST MEDICAL HISTORY: History reviewed. No  pertinent past medical history.  PAST SURGICAL HISTORY:      Past Surgical History:  Procedure Laterality Date  . INGUINAL HERNIA REPAIR Left 2003   in [place]     FAMILY HISTORY:      Family History  Problem Relation Age of Onset  . No Known Problems Mother   . No Known Problems Father   . No Known Problems Sister   . Diabetes Brother   . Diabetes Brother   . Diabetes Brother   . No Known Problems Brother   . No Known Problems Sister   . No Known Problems Sister      SOCIAL HISTORY: Social History          Socioeconomic History  . Marital status: Unknown    Spouse name: Not on file  . Number of children: Not on file  . Years of education: Not on file  . Highest education level: Not on file  Occupational History  . Not on file  Social Needs  . Financial resource strain: Not on file  . Food insecurity    Worry: Not on file    Inability: Not on file  . Transportation needs    Medical: Not on file    Non-medical: Not on file  Tobacco Use  . Smoking status: Former Smoker  . Smokeless tobacco: Never Used  Substance and Sexual Activity  . Alcohol use: Never    Frequency: Never    Comment: quit 2010  . Drug use: Never  . Sexual activity: Not on file    Other Topics Concern  . Not on file  Social History Narrative  . Not on file      PHYSICAL EXAM:    Vitals:   10/23/18 1558  BP: 135/87  Pulse: 97   Body mass index is 25.7 kg/m. Weight: 76.7 kg (169 lb)   GENERAL: Alert, active, oriented x3  HEENT: Pupils equal reactive to light. Extraocular movements are intact. Sclera clear. Palpebral conjunctiva normal red color.Pharynx clear.  NECK: Supple with no palpable mass and no adenopathy.  LUNGS: Sound clear with no rales rhonchi or wheezes.  HEART: Regular rhythm S1 and S2 without murmur.  ABDOMEN: Soft and depressible, nontender with no palpable mass, no hepatomegaly.  Large left inguinal hernia,  incarcerated, moderately tender to palpation.  There is no skin changes.  There is no sign of strangulation.  EXTREMITIES: Well-developed well-nourished symmetrical with no dependent edema.  NEUROLOGICAL: Awake alert oriented, facial expression symmetrical, moving all extremities.  REVIEW OF DATA: I have reviewed the following data today: No results found for any previous visit.     ASSESSMENT: [name] is a 53 y.o. male presenting for consultation for left recurrent incarcerated inguinal hernia.    The patient presents with a symptomatic, recurrent, incarcerated omentum inguinal hernia. Patient was oriented about the diagnosis of recurrent inguinal hernia and its implication. The patient was oriented about the treatment alternatives (observation vs surgical repair). Due to patient symptoms, repair is recommended. Patient oriented about the surgical procedure, the use of mesh and its risk of complications such as: infection, bleeding, injury to vas deference, vasculature and testicle, injury to bowel or bladder, and chronic pain.  Since patient has inguinal hernia repairs x2 on the same side I discussed with him the recommendation of starting with laparoscopic approach.  Due to the size and the amount of tissue incarcerated in the hernia I disclosed that it might be difficult to complete the surgery laparoscopically and open approach might be needed.  He understood and agreed to proceed.  Unilateral recurrent inguinal hernia without obstruction or gangrene [K40.91]  PLAN: 1. Laparoscopic vs open recurrent incarcerated inguinal hernia repair with mesh (24097, 35329) 2. Avoid heavy lifting until surgery next week.  3. Do not take aspirin  Patient verbalized understanding, all questions were answered, and were agreeable with the plan outlined above.    Herbert Pun, MD  Electronically signed by Herbert Pun, MD

## 2018-10-25 ENCOUNTER — Other Ambulatory Visit: Payer: Self-pay

## 2018-10-25 ENCOUNTER — Encounter
Admission: RE | Admit: 2018-10-25 | Discharge: 2018-10-25 | Disposition: A | Discharge status: Home / Self Care | Payer: Self-pay | Source: Ambulatory Visit | Attending: General Surgery | Admitting: General Surgery

## 2018-10-25 DIAGNOSIS — Z01812 Encounter for preprocedural laboratory examination: Secondary | ICD-10-CM | POA: Insufficient documentation

## 2018-10-25 HISTORY — DX: Unilateral inguinal hernia, without obstruction or gangrene, not specified as recurrent: K40.90

## 2018-10-25 NOTE — Patient Instructions (Signed)
Your procedure is scheduled on: 10-30-18 TUESDAY Su procedimiento est programado para: Report to [facility] a: To find out your arrival time please call [phone] between 1PM - 3PM on 10-26-18 FRIDAY Para saber su hora de llegada por favor llame al [phone] entre la 1PM - 3PM el da:   Remember: Instructions that are not followed completely may result in serious medical risk, up to and including death,  or upon the discretion of your surgeon and anesthesiologist your surgery may need to be rescheduled.  Recuerde: Las instrucciones que no se siguen completamente Heritage manager en un riesgo de salud grave, incluyendo hasta  la [place] o a discrecin de su cirujano y Environmental health practitioner, su ciruga se puede posponer.   __X_ 1.Do not eat food after midnight the night before your procedure. No    gum chewing or hard candies. You may drink clear liquids up to 2 hours     before you are scheduled to arrive for your surgery- DO not drink clear     Liquids within 2 hours of the start of your surgery.     Clear Liquids include:    water, apple juice without pulp, clear carbohydrate drink such as    Clearfast of Gart[place]ade, Black Coffee or Tea (Do not add anything to coffee or tea).      No coma nada despus de la medianoche de la noche anterior a su    procedimiento. No coma chicles ni caramelos duros. Puede tomar    lquidos claros hasta 2 horas antes de su hora programada de llegada al     hospital para su procedimiento. No tome lquidos claros durante el     transcurso de las 2 horas de su llegada programada al hospital para su     procedimiento, ya que esto puede llevar a que su procedimiento se    retrase o tenga que volver a Health and safety inspector.  Los lquidos claros incluyen:          - Agua o jugo de [place] sin pulpa          - Bebidas claras con carbohidratos como ClearFast o Gatorade          - Caf negro o t claro (sin leche, sin cremas, no  agregue nada al caf ni al t)  No tome nada que no est en esta lista.  Los pacientes con diabetes tipo 1 y tipo 2 solo deben Agricultural engineer.  Llame a la clnica de PreCare o a la unidad de Same Day Surgery si  tiene alguna pregunta sobre estas instrucciones.              _X__ 2.Do Not Smoke or use e-cigarettes For 24 Hours Prior to Your Surgery.    Do not use any chewable tobacco products for at least 6   hours prior to surgery.    No fume ni use cigarrillos electrnicos durante las 24 horas previas    a su [place].  No use ningn producto de tabaco masticable durante   al menos 6 horas antes de la [place].     __X_ 3. No alcohol for 24 hours before or after surgery.    No tome alcohol durante las 24 horas antes ni despus de la [place].   ____4. Bring all medications with you on the day of surgery if instructed.    Lleve todos los medicamentos con usted el da de su ciruga si se le  ha indicado as.   _X___ 5. Notify your doctor if there is any change in your medical condition (cold,fever, infections).    Informe a su mdico si hay algn cambio en su condicin mdica  (resfriado, fiebre, infecciones).   Do not wear jewelry, make-up, hairpins, clips or nail polish.  No use joyas, maquillajes, pinzas/ganchos para el cabello ni esmalte de uas.  Do not wear lotions, powders, or perfumes.   No use lociones, polvos o perfumes.      Do not shave 48 hours prior to surgery. Men may shave face and neck.  No se afeite 48 horas antes de la [place].  Los hombres pueden [employer] cara  y el cuello.   Do not bring valuables to the hospital.   No lleve objetos de valor al hospital.  [facility] is not responsible for any belongings or valuables.  [place] no se hace responsable de ningn tipo de pertenencias u objetos de Licensed conveyancer.               Contacts, dentures or bridgework may not be worn into surgery.  Los lentes de [place], las dentaduras postizas o puentes no se pueden usar en la  [place].   Leave your suitcase in the car. After surgery it may be brought to your room.  Deje su maleta en el auto.  Despus de la ciruga podr traerla a su habitacin.   For patients admitted to the hospital, discharge time is determined by your  treatment team.  Para los pacientes que sean ingresados al hospital, el tiempo en el cual se le  dar de alta es determinado por su equipo de [place].   Patients discharged the day of surgery will not be allowed to drive home. A los pacientes que se les da de alta el mismo da de la ciruga no se les permitir conducir a Higher education careers adviser.   Please read over the following fact sheets that you were given: Por favor lea las siguientes hojas de informacin que le dieron:     ____ Take these medicines the morning of surgery with A SIP OF WATER:          [employer] medicinas la maana de la ciruga con UN SORBO DE AGUA:  1. NONE  2.   3.   4.       5.  6.  ____ Fleet Enema (as directed)          Enema de Fleet (segn lo indicado)    _X___ Use CHG Soap as directed          Utilice el jabn de CHG segn lo indicado  ____ Use inhalers on the day of surgery          Use los inhaladores el da de la ciruga  ____ Stop metformin 2 days prior to surgery          Deje de tomar el metformin 2 das antes de la ciruga    ____ Take 1/2 of usual insulin dose the night before surgery and none on the morning of surgery           Tome la mitad de la dosis habitual de insulina la noche antes de la [place] y no tome nada en la maana de la             ciruga  ____ Stop Coumadin/Plavix/aspirin on           Deje de tomar el Coumadin/Plavix/aspirina el da:  _X___  Stop Anti-inflammatories NOW-DO NOT TAKE ADVIL, IBUPROFEN, ALEVE, NAPROXEN-OK TO TAKE TYLENOL IF NEEDED          Deje de tomar antiinflamatorios el da:   ____ Stop supplements until after surgery            Deje de tomar suplementos hasta despus de la ciruga  ____ Bring C-Pap to the  hospital          Lleve el C-Pap al hospital

## 2018-10-26 ENCOUNTER — Other Ambulatory Visit: Payer: Self-pay

## 2018-10-26 ENCOUNTER — Other Ambulatory Visit
Admission: RE | Admit: 2018-10-26 | Discharge: 2018-10-26 | Disposition: A | Discharge status: Home / Self Care | Payer: HRSA Program | Source: Ambulatory Visit | Attending: General Surgery | Admitting: General Surgery

## 2018-10-26 DIAGNOSIS — Z01812 Encounter for preprocedural laboratory examination: Secondary | ICD-10-CM | POA: Diagnosis present

## 2018-10-26 DIAGNOSIS — Z20828 Contact with and (suspected) exposure to other viral communicable diseases: Secondary | ICD-10-CM | POA: Diagnosis not present

## 2018-10-26 LAB — SARS CORONAVIRUS 2 (TAT 6-24 HRS): SARS Coronavirus 2: NEGATIVE

## 2018-10-30 ENCOUNTER — Encounter: Payer: Self-pay | Admitting: Emergency Medicine

## 2018-10-30 ENCOUNTER — Ambulatory Visit
Admission: RE | Admit: 2018-10-30 | Discharge: 2018-10-30 | Disposition: A | Discharge status: Home / Self Care | Payer: Self-pay | Attending: General Surgery | Admitting: General Surgery

## 2018-10-30 ENCOUNTER — Ambulatory Visit: Payer: Self-pay | Admitting: Anesthesiology

## 2018-10-30 ENCOUNTER — Encounter
Admission: RE | Disposition: A | Discharge status: Home / Self Care | Payer: Self-pay | Source: Home / Self Care | Attending: General Surgery

## 2018-10-30 DIAGNOSIS — Z5331 Laparoscopic surgical procedure converted to open procedure: Secondary | ICD-10-CM | POA: Insufficient documentation

## 2018-10-30 DIAGNOSIS — Z87891 Personal history of nicotine dependence: Secondary | ICD-10-CM | POA: Insufficient documentation

## 2018-10-30 DIAGNOSIS — K4031 Unilateral inguinal hernia, with obstruction, without gangrene, recurrent: Secondary | ICD-10-CM | POA: Insufficient documentation

## 2018-10-30 DIAGNOSIS — K429 Umbilical hernia without obstruction or gangrene: Secondary | ICD-10-CM | POA: Insufficient documentation

## 2018-10-30 HISTORY — PX: INGUINAL HERNIA REPAIR: SHX194

## 2018-10-30 SURGERY — REPAIR, HERNIA, INGUINAL, LAPAROSCOPIC
Anesthesia: General | Site: Groin | Laterality: Left

## 2018-10-30 MED ORDER — ROCURONIUM BROMIDE 50 MG/5ML IV SOLN
INTRAVENOUS | Status: AC
  Filled 2018-10-30: qty 1

## 2018-10-30 MED ORDER — MEPERIDINE HCL 50 MG/ML IJ SOLN: 6.2500 mg | INTRAMUSCULAR | Status: DC | PRN

## 2018-10-30 MED ORDER — FENTANYL CITRATE (PF) 100 MCG/2ML IJ SOLN
INTRAMUSCULAR | Status: AC
  Filled 2018-10-30: qty 2

## 2018-10-30 MED ORDER — SUGAMMADEX SODIUM 200 MG/2ML IV SOLN
INTRAVENOUS | Status: AC
  Filled 2018-10-30: qty 2

## 2018-10-30 MED ORDER — HYDROCODONE-ACETAMINOPHEN 5-325 MG PO TABS: 1.0000 | ORAL_TABLET | ORAL | 0 refills | Status: AC | PRN

## 2018-10-30 MED ORDER — MIDAZOLAM HCL 2 MG/2ML IJ SOLN
INTRAMUSCULAR | Status: AC
  Filled 2018-10-30: qty 2

## 2018-10-30 MED ORDER — LIDOCAINE HCL (CARDIAC) PF 100 MG/5ML IV SOSY
PREFILLED_SYRINGE | INTRAVENOUS | Status: DC | PRN
  Administered 2018-10-30: 100 mg via INTRAVENOUS

## 2018-10-30 MED ORDER — PHENYLEPHRINE HCL (PRESSORS) 10 MG/ML IV SOLN
INTRAVENOUS | Status: DC | PRN
  Administered 2018-10-30 (×5): 100 ug via INTRAVENOUS

## 2018-10-30 MED ORDER — LACTATED RINGERS IV SOLN
INTRAVENOUS | Status: DC
  Administered 2018-10-30 (×2): via INTRAVENOUS

## 2018-10-30 MED ORDER — PROPOFOL 10 MG/ML IV BOLUS
INTRAVENOUS | Status: DC | PRN
  Administered 2018-10-30: 180 mg via INTRAVENOUS

## 2018-10-30 MED ORDER — SUGAMMADEX SODIUM 200 MG/2ML IV SOLN
INTRAVENOUS | Status: DC | PRN
  Administered 2018-10-30: 150 mg via INTRAVENOUS

## 2018-10-30 MED ORDER — OXYCODONE HCL 5 MG PO TABS
5.0000 mg | ORAL_TABLET | Freq: Once | ORAL | Status: AC | PRN
  Administered 2018-10-30: 19:00:00 5 mg via ORAL

## 2018-10-30 MED ORDER — ACETAMINOPHEN 10 MG/ML IV SOLN
INTRAVENOUS | Status: DC | PRN
  Administered 2018-10-30: 1000 mg via INTRAVENOUS

## 2018-10-30 MED ORDER — FENTANYL CITRATE (PF) 100 MCG/2ML IJ SOLN
25.0000 ug | INTRAMUSCULAR | Status: DC | PRN
  Administered 2018-10-30: 18:00:00 25 ug via INTRAVENOUS
  Administered 2018-10-30: 18:00:00 50 ug via INTRAVENOUS
  Administered 2018-10-30 (×3): 25 ug via INTRAVENOUS

## 2018-10-30 MED ORDER — PROMETHAZINE HCL 25 MG/ML IJ SOLN: 6.2500 mg | INTRAMUSCULAR | Status: DC | PRN

## 2018-10-30 MED ORDER — FAMOTIDINE 20 MG PO TABS
20.0000 mg | ORAL_TABLET | Freq: Once | ORAL | Status: AC
  Administered 2018-10-30: 13:00:00 20 mg via ORAL

## 2018-10-30 MED ORDER — OXYCODONE HCL 5 MG PO TABS
ORAL_TABLET | ORAL | Status: AC
  Filled 2018-10-30: qty 1

## 2018-10-30 MED ORDER — ROCURONIUM BROMIDE 50 MG/5ML IV SOLN
INTRAVENOUS | Status: AC
  Filled 2018-10-30: qty 2

## 2018-10-30 MED ORDER — DEXAMETHASONE SODIUM PHOSPHATE 10 MG/ML IJ SOLN
INTRAMUSCULAR | Status: DC | PRN
  Administered 2018-10-30: 8 mg via INTRAVENOUS

## 2018-10-30 MED ORDER — FENTANYL CITRATE (PF) 100 MCG/2ML IJ SOLN
INTRAMUSCULAR | Status: AC
  Administered 2018-10-30: 18:00:00 25 ug via INTRAVENOUS
  Filled 2018-10-30: qty 2

## 2018-10-30 MED ORDER — KETOROLAC TROMETHAMINE 30 MG/ML IJ SOLN
INTRAMUSCULAR | Status: AC
  Filled 2018-10-30: qty 1

## 2018-10-30 MED ORDER — EPHEDRINE SULFATE 50 MG/ML IJ SOLN
INTRAMUSCULAR | Status: DC | PRN
  Administered 2018-10-30: 10 mg via INTRAVENOUS

## 2018-10-30 MED ORDER — SODIUM CHLORIDE (PF) 0.9 % IJ SOLN
INTRAMUSCULAR | Status: AC
  Filled 2018-10-30: qty 50

## 2018-10-30 MED ORDER — FAMOTIDINE 20 MG PO TABS
ORAL_TABLET | ORAL | Status: AC
  Administered 2018-10-30: 20 mg via ORAL
  Filled 2018-10-30: qty 1

## 2018-10-30 MED ORDER — BUPIVACAINE LIPOSOME 1.3 % IJ SUSP
INTRAMUSCULAR | Status: AC
  Filled 2018-10-30: qty 20

## 2018-10-30 MED ORDER — BUPIVACAINE LIPOSOME 1.3 % IJ SUSP
INTRAMUSCULAR | Status: DC | PRN
  Administered 2018-10-30: 20 mL

## 2018-10-30 MED ORDER — CEFAZOLIN SODIUM-DEXTROSE 2-4 GM/100ML-% IV SOLN
INTRAVENOUS | Status: AC
  Filled 2018-10-30: qty 100

## 2018-10-30 MED ORDER — SODIUM CHLORIDE 0.9 % IV SOLN
INTRAVENOUS | Status: DC | PRN
  Administered 2018-10-30: 30 ug/min via INTRAVENOUS

## 2018-10-30 MED ORDER — KETOROLAC TROMETHAMINE 30 MG/ML IJ SOLN
INTRAMUSCULAR | Status: DC | PRN
  Administered 2018-10-30: 30 mg via INTRAVENOUS

## 2018-10-30 MED ORDER — ACETAMINOPHEN NICU IV SYRINGE 10 MG/ML
INTRAVENOUS | Status: AC
  Filled 2018-10-30: qty 1

## 2018-10-30 MED ORDER — LIDOCAINE HCL (PF) 2 % IJ SOLN
INTRAMUSCULAR | Status: AC
  Filled 2018-10-30: qty 10

## 2018-10-30 MED ORDER — MIDAZOLAM HCL 2 MG/2ML IJ SOLN
INTRAMUSCULAR | Status: DC | PRN
  Administered 2018-10-30: 2 mg via INTRAVENOUS

## 2018-10-30 MED ORDER — OXYCODONE HCL 5 MG/5ML PO SOLN: 5.0000 mg | Freq: Once | ORAL | Status: AC | PRN

## 2018-10-30 MED ORDER — DEXAMETHASONE SODIUM PHOSPHATE 10 MG/ML IJ SOLN
INTRAMUSCULAR | Status: AC
  Filled 2018-10-30: qty 1

## 2018-10-30 MED ORDER — BUPIVACAINE-EPINEPHRINE (PF) 0.5% -1:200000 IJ SOLN
INTRAMUSCULAR | Status: DC | PRN
  Administered 2018-10-30: 30 mL via PERINEURAL

## 2018-10-30 MED ORDER — PROPOFOL 10 MG/ML IV BOLUS
INTRAVENOUS | Status: AC
  Filled 2018-10-30: qty 20

## 2018-10-30 MED ORDER — FENTANYL CITRATE (PF) 100 MCG/2ML IJ SOLN
INTRAMUSCULAR | Status: DC | PRN
  Administered 2018-10-30 (×2): 50 ug via INTRAVENOUS
  Administered 2018-10-30: 100 ug via INTRAVENOUS
  Administered 2018-10-30: 50 ug via INTRAVENOUS

## 2018-10-30 MED ORDER — ONDANSETRON HCL 4 MG/2ML IJ SOLN
INTRAMUSCULAR | Status: DC | PRN
  Administered 2018-10-30: 4 mg via INTRAVENOUS

## 2018-10-30 MED ORDER — ROCURONIUM BROMIDE 100 MG/10ML IV SOLN
INTRAVENOUS | Status: DC | PRN
  Administered 2018-10-30: 50 mg via INTRAVENOUS
  Administered 2018-10-30: 20 mg via INTRAVENOUS
  Administered 2018-10-30: 10 mg via INTRAVENOUS
  Administered 2018-10-30: 20 mg via INTRAVENOUS
  Administered 2018-10-30: 10 mg via INTRAVENOUS
  Administered 2018-10-30: 20 mg via INTRAVENOUS

## 2018-10-30 MED ORDER — CEFAZOLIN SODIUM-DEXTROSE 2-4 GM/100ML-% IV SOLN
2.0000 g | INTRAVENOUS | Status: AC
  Administered 2018-10-30: 14:00:00 2 g via INTRAVENOUS

## 2018-10-30 MED ORDER — ONDANSETRON HCL 4 MG/2ML IJ SOLN
INTRAMUSCULAR | Status: AC
  Filled 2018-10-30: qty 2

## 2018-10-30 MED ORDER — BUPIVACAINE-EPINEPHRINE (PF) 0.5% -1:200000 IJ SOLN
INTRAMUSCULAR | Status: AC
  Filled 2018-10-30: qty 30

## 2018-10-30 SURGICAL SUPPLY — 57 items
BLADE CLIPPER SURG (BLADE) ×3 IMPLANT
BLADE SURG SZ11 CARB STEEL (BLADE) ×3 IMPLANT
BULB RESERV EVAC DRAIN JP 100C (MISCELLANEOUS) ×2 IMPLANT
CANISTER SUCT 1200ML W/VALVE (MISCELLANEOUS) ×3 IMPLANT
CHLORAPREP W/TINT 26 (MISCELLANEOUS) ×3 IMPLANT
COVER WAND RF STERILE (DRAPES) ×3 IMPLANT
DERMABOND ADVANCED (GAUZE/BANDAGES/DRESSINGS) ×2
DERMABOND ADVANCED .7 DNX12 (GAUZE/BANDAGES/DRESSINGS) ×1 IMPLANT
DEVICE SECURE STRAP 25 ABSORB (INSTRUMENTS) ×3 IMPLANT
DISSECT BALLN SPACEMKR OVL PDB (BALLOONS) ×3
DISSECT BALLN SPACEMKR OVL PRO (TROCAR) ×3
DISSECTOR BALLN SPCMKR OVL PDB (BALLOONS) ×1 IMPLANT
DISSECTOR BALLN SPCMKR OVL PRO (TROCAR) ×1 IMPLANT
DRAIN CHANNEL JP 15F RND 16 (MISCELLANEOUS) ×2 IMPLANT
DRSG OPSITE POSTOP 4X8 (GAUZE/BANDAGES/DRESSINGS) ×2 IMPLANT
DRSG TEGADERM 4X4.75 (GAUZE/BANDAGES/DRESSINGS) ×2 IMPLANT
ELECT REM PT RETURN 9FT ADLT (ELECTROSURGICAL) ×3
ELECTRODE REM PT RTRN 9FT ADLT (ELECTROSURGICAL) ×1 IMPLANT
GLOVE BIO SURGEON STRL SZ 6.5 (GLOVE) ×2 IMPLANT
GLOVE BIO SURGEONS STRL SZ 6.5 (GLOVE) ×1
GLOVE BIOGEL PI IND STRL 6.5 (GLOVE) ×1 IMPLANT
GLOVE BIOGEL PI INDICATOR 6.5 (GLOVE) ×4
GOWN STRL REUS W/ TWL LRG LVL3 (GOWN DISPOSABLE) ×2 IMPLANT
GOWN STRL REUS W/TWL LRG LVL3 (GOWN DISPOSABLE) ×4
IRRIGATION STRYKERFLOW (MISCELLANEOUS) IMPLANT
IRRIGATOR STRYKERFLOW (MISCELLANEOUS) ×3
IV NS 1000ML (IV SOLUTION) ×2
IV NS 1000ML BAXH (IV SOLUTION) IMPLANT
JELLY LUB 2OZ STRL (MISCELLANEOUS) ×2
JELLY LUBE 2OZ STRL (MISCELLANEOUS) ×1 IMPLANT
KIT TURNOVER KIT A (KITS) ×3 IMPLANT
KITTNER LAPARASCOPIC 5X40 (MISCELLANEOUS) ×5 IMPLANT
LABEL OR SOLS (LABEL) ×3 IMPLANT
NEEDLE HYPO 22GX1.5 SAFETY (NEEDLE) ×3 IMPLANT
NS IRRIG 500ML POUR BTL (IV SOLUTION) ×3 IMPLANT
PACK LAP CHOLECYSTECTOMY (MISCELLANEOUS) ×3 IMPLANT
PENCIL ELECTRO HAND CTR (MISCELLANEOUS) ×3 IMPLANT
SCISSORS METZENBAUM CVD 33 (INSTRUMENTS) IMPLANT
SET TUBE SMOKE EVAC HIGH FLOW (TUBING) ×3 IMPLANT
SHEARS HARMONIC ACE PLUS 36CM (ENDOMECHANICALS) ×2 IMPLANT
SPONGE GAUZE 2X2 8PLY STER LF (GAUZE/BANDAGES/DRESSINGS) ×1
SPONGE GAUZE 2X2 8PLY STRL LF (GAUZE/BANDAGES/DRESSINGS) ×1 IMPLANT
SPONGE LAP 18X18 RF (DISPOSABLE) ×2 IMPLANT
STAPLER SKIN PROX 35W (STAPLE) ×2 IMPLANT
SUT ETHILON 3-0 FS-10 30 BLK (SUTURE) ×3
SUT MNCRL AB 4-0 PS2 18 (SUTURE) IMPLANT
SUT PDS AB 0 CT1 27 (SUTURE) ×4 IMPLANT
SUT SURGILON 0 BLK 1X30 T-5 (SUTURE) ×2 IMPLANT
SUT VIC AB 2-0 SH 27 (SUTURE) ×6
SUT VIC AB 2-0 SH 27XBRD (SUTURE) IMPLANT
SUT VIC AB 3-0 SH 27 (SUTURE) ×4
SUT VIC AB 3-0 SH 27X BRD (SUTURE) IMPLANT
SUT VICRYL 0 AB UR-6 (SUTURE) ×3 IMPLANT
SUTURE EHLN 3-0 FS-10 30 BLK (SUTURE) IMPLANT
TRAY FOLEY MTR SLVR 16FR STAT (SET/KITS/TRAYS/PACK) ×3 IMPLANT
TROCAR 5MM SINGLE VERSAONE (TROCAR) ×6 IMPLANT
TROCAR BALLN 10M OMST10SB SPAC (TROCAR) ×3 IMPLANT

## 2018-10-30 NOTE — Interval H&P Note (Signed)
History and Physical Interval Note:  10/30/2018 1:08 PM  Clinton Cole  has presented today for surgery, with the diagnosis of k40.91 incarcerated inguinal hernia.  The various methods of treatment have been discussed with the patient and family. After consideration of risks, benefits and other options for treatment, the patient has consented to  Procedure(s): LAPAROSCOPIC VS OPEN INGUINAL HERNIA (Left) as a surgical intervention.  The patient's history has been reviewed, patient examined, no change in status, stable for surgery.  I have reviewed the patient's chart and labs.  Left groin marked in the pre procedure room. Questions were answered to the patient's satisfaction.     Herbert Pun

## 2018-10-30 NOTE — Anesthesia Post-op Follow-up Note (Signed)
Anesthesia QCDR form completed.        

## 2018-10-30 NOTE — Op Note (Signed)
Preoperative diagnosis: Left recurrent incarcerated inguinal hernia.                                              Umbilical hernia   Postoperative diagnosis: Left recurrent strangulated inguinal hernia.                                              Umbilical hernia  Procedure: Attempted Transabdominal preperitoneal laparoscopic (TAPP) repair of left recurrent inguinal hernia.                      Open inguinal hernia repair                      Open umbilical hernia repair  Anesthesia: GETA  Surgeon: Dr. Windell Moment  Assistant surgeon: Dr. Lysle Pearl  Wound Classification: Contaminated  Indications:  Patient is a 53 y.o. male developed a recurrent incarcerated left inguinal hernia. Repair was indicated, and because of the patient's previous multiple open hernia repair, laparoscopic repair was elected.  Findings: 1.  Huge indirect strangulated omentum inguinal hernia  2.  Unable to reduce the omentum laparoscopically.  Decision to convert to open was done 3.  Due to the large amount of dark liquefied hematoma from the strangulated tissue and the large amount of strangulated omentum, decision was not to image and proceed with primary repair. 4.  Difficulty identifying vas deferens and cord structures.  Urology assisted in the identification of the cord structures. 5.  No bowel was affected during the procedure.  There was no bowel included in the incarcerated tissue. 6.  Adequate hemostasis  Description of procedure: The patient was taken to the operating room and the correct side of surgery was verified. The patient was placed supine with arms tucked at the sides. After obtaining adequate anesthesia, the patient's abdomen was prepped and draped in standard sterile fashion. The patient was placed in the Trendelenburg position. A time-out was completed verifying correct patient, procedure, site, positioning, and implant(s) and/or special equipment prior to beginning this procedure. A Veress  needle was placed at the umbilicus and pneumoperitoneum created with insufflation of carbon dioxide to 15 mmHg. After the Veress needle was removed, a 10-mm trocar was placed infraumbilically and the 30 angled laparoscope inserted. Two 5-mm trocars were then placed lateral to the rectus sheath under direct visualization. Both inguinal regions were inspected and the median umbilical ligament, medial umbilical ligament, and lateral umbilical fold were identified.   A large amount of hard omentum was identified going through the indirect orifice of the left inguinal hernia.  Careful dissection around the incarcerated tissue was done.  Large amount of liquefied hematoma was draining as tissue was tried to be reduced.  Externally there was a large indurated omental fat that appeared to be previously reduced from the incarcerated hernia.  After multiple attempts to continue reducing the omentum intraperitoneally, the decision was to proceed with the preperitoneal flap. The peritoneum was incised with endoscopic scissors along a line 2 cm above the superior edge of the hernia defect, extending from the median umbilical ligament to the anterior superior iliac spine. The peritoneal flap was mobilized inferiorly using blunt and sharp dissection. The inferior epigastric vessels were exposed  and the pubic symphysis was identified.  Laterally, abundant amount of scar tissue was found from the previous inguinal hernia repairs.  I was unable to reach safely to the internal inguinal ring due to the amount of scar tissue.  At this time it was decided to abort the laparoscopic approach and proceed with open inguinal approach.   An incision was marked in a natural skin crease and planned to end near the pubic tubercle. The skin crease incision was made with a knife and deepened through Scarpa's and Camper's fascia with electrocautery until the aponeurosis of the external oblique was encountered.  Abnormal scar tissue was  encountered.  The external oblique was unable to be open due to the amount of scar tissue.  Huge hernia sac was able to be identified.  He was carefully dissected around.  There was a very difficult dissection that checks more than 2 hours to be able to freed the sac.  An initial take sac was open and a thinner take was opened to be able to access the strangulated omentum.  Abundant amount of liquefied hematoma was accessed.  The omentum was divided with harmonic device.  The hernia sac was excised.  He was close close to the internal inguinal ring.  I was unable to identify the cord structures.  Urology service was consulted intraoperatively for assistance to identify the important structures.  Urology service was able to identify the vas deferens and cord structures.  Identification of the cord structures I was able to close the internal inguinal ring with Surgilon 0.  I decided not to get a mesh due to the amount of strangulated omental tissue and liquefied hematoma.  The tissue was irrigated and hemostasis achieved.  A 15 French drain was left in place.  The testicle was confirmed to be in the scrotum.  Subdermal tissue was closed with 3-0 Vicryl sutures.  Skin was closed with staples The abdominal cavity was again insufflated and abdominal cavity inspected.  The inguinal ring was confirmed to be close.  There was no bleeding. The supraumbilical incision was needed to be enlarged.  Umbilical hernia sac was dissected.  Preperitoneal fat herniating through the defect was resected.  Umbilical hernia was closed with 0 PDS.  No mesh was placed.    The trocar incisions were closed using monocryl and skin adhesive dressings applied.  The patient tolerated the procedure well and was taken to the postanesthesia care unit in stable condition.   Specimen: Hernia sac and omental fat                     Umbilical hernia content  Complications: None  Estimated Blood Loss: 50 mL

## 2018-10-30 NOTE — Discharge Instructions (Signed)
°  Diet: Resume home heart healthy regular diet.   Activity: No heavy lifting (children, pets, laundry, garbage) or strenuous activity until follow-up, but light activity and walking are encouraged. Do not drive or drink alcohol if taking narcotic pain medications.  Wound care: Remove dressing tomorrow. Once dressing removed, may shower with soapy water and pat dry (do not rub incisions), but no baths or submerging incision underwater until follow-up. (no swimming)   Chart drain daily as instructed  Medications: Resume all home medications except . For mild to moderate pain: acetaminophen (Tylenol) r ibuprofen (if no kidney disease). Combining Tylenol with alcohol can substantially increase your risk of causing liver disease. Narcotic pain medications, if prescribed, can be used for severe pain, though may cause nausea, constipation, and drowsiness. Do not combine Tylenol and Norco within a 6 hour period as Norco contains Tylenol. If you do not need the narcotic pain medication, you do not need to fill the prescription.  Call office [phone]) at any time if any questions, worsening pain, fevers/chills, bleeding, drainage from incision site, or other concerns.   AMBULATORY SURGERY  DISCHARGE INSTRUCTIONS   1) The drugs that you were given will stay in your system until tomorrow so for the next 24 hours you should not:  A) Drive an automobile B) Make any legal decisions C) Drink any alcoholic beverage   2) You may resume regular meals tomorrow.  Today it is better to start with liquids and gradually work up to solid foods.  You may eat anything you prefer, but it is better to start with liquids, then soup and crackers, and gradually work up to solid foods.   3) Please notify your doctor immediately if you have any unusual bleeding, trouble breathing, redness and pain at the surgery site, drainage, fever, or pain not relieved by medication.    4) Additional  Instructions:        Please contact your physician with any problems or Same Day Surgery at [phone], Monday through Friday 6 am to 4 pm, or [place] [facility] number at [phone].

## 2018-10-30 NOTE — Anesthesia Procedure Notes (Signed)
Procedure Name: Intubation Date/Time: 10/30/2018 1:44 PM Performed by: Kelton Pillar, CRNA Pre-anesthesia Checklist: Patient identified, Emergency Drugs available, Suction available and Patient being monitored Patient Re-evaluated:Patient Re-evaluated prior to induction Oxygen Delivery Method: Circle system utilized Preoxygenation: Pre-oxygenation with 100% oxygen Induction Type: IV induction Ventilation: Mask ventilation without difficulty Laryngoscope Size: McGraph and 3 Tube type: Oral Tube size: 7.5 mm Number of attempts: 1 Airway Equipment and Method: Stylet and Oral airway Placement Confirmation: ETT inserted through vocal cords under direct vision,  positive ETCO2 and breath sounds checked- equal and bilateral Secured at: 22 cm Tube secured with: Tape Dental Injury: Teeth and Oropharynx as per pre-operative assessment

## 2018-10-30 NOTE — Transfer of Care (Signed)
Immediate Anesthesia Transfer of Care Note  Patient: Clinton Cole  Procedure(s) Performed: LAPAROSCOPIC INGUINAL HERNIA (Left Groin)  Patient Location: PACU  Anesthesia Type:General  Level of Consciousness: sedated  Airway & Oxygen Therapy: Patient Spontanous Breathing and Patient connected to face mask oxygen  Post-op Assessment: Report given to RN and Post -op Vital signs reviewed and stable  Post vital signs: Reviewed and stable  Last Vitals:  Vitals Value Taken Time  BP 117/74 10/30/18 1754  Temp    Pulse 98 10/30/18 1758  Resp 10 10/30/18 1758  SpO2 99 % 10/30/18 1758  Vitals shown include unvalidated device data.  Last Pain:  Vitals:   10/30/18 1224  TempSrc: Tympanic  PainSc: 0-No pain         Complications: No apparent anesthesia complications

## 2018-10-30 NOTE — Anesthesia Preprocedure Evaluation (Signed)
Anesthesia Evaluation  Patient identified by MRN, date of birth, ID band Patient awake    Reviewed: Allergy & Precautions, NPO status , Patient's Chart, lab work & pertinent test results  History of Anesthesia Complications Negative for: history of anesthetic complications  Airway Mallampati: II  TM Distance: >3 FB Neck ROM: Full    Dental  (+) Poor Dentition   Pulmonary neg sleep apnea, neg COPD, Current Smoker and Patient abstained from smoking.,    breath sounds clear to auscultation- rhonchi (-) wheezing      Cardiovascular Exercise Tolerance: Good (-) hypertension(-) CAD, (-) Past MI, (-) Cardiac Stents and (-) CABG  Rhythm:Regular Rate:Normal - Systolic murmurs and - Diastolic murmurs    Neuro/Psych neg Seizures negative neurological ROS  negative psych ROS   GI/Hepatic negative GI ROS, Neg liver ROS,   Endo/Other  negative endocrine ROSneg diabetes  Renal/GU negative Renal ROS     Musculoskeletal negative musculoskeletal ROS (+)   Abdominal (+) - obese,   Peds  Hematology negative hematology ROS (+)   Anesthesia Other Findings Past Medical History: No date: Hernia, inguinal   Reproductive/Obstetrics                             Anesthesia Physical Anesthesia Plan  ASA: II  Anesthesia Plan: General   Post-op Pain Management:    Induction: Intravenous  PONV Risk Score and Plan: 0 and Ondansetron  Airway Management Planned: Oral ETT  Additional Equipment:   Intra-op Plan:   Post-operative Plan: Extubation in OR  Informed Consent: I have reviewed the patients History and Physical, chart, labs and discussed the procedure including the risks, benefits and alternatives for the proposed anesthesia with the patient or authorized representative who has indicated his/her understanding and acceptance.     Dental advisory given  Plan Discussed with: CRNA and  Anesthesiologist  Anesthesia Plan Comments:         Anesthesia Quick Evaluation

## 2018-10-30 NOTE — Anesthesia Postprocedure Evaluation (Signed)
Anesthesia Post Note  Patient: Clinton Cole  Procedure(s) Performed: LAPAROSCOPIC INGUINAL HERNIA (Left Groin)  Patient location during evaluation: PACU Anesthesia Type: General Level of consciousness: awake and alert Pain management: pain level controlled Vital Signs Assessment: post-procedure vital signs reviewed and stable Respiratory status: spontaneous breathing and respiratory function stable Cardiovascular status: stable Anesthetic complications: no     Last Vitals:  Vitals:   10/30/18 1856 10/30/18 1921  BP: 124/86 124/87  Pulse: (!) 104   Resp: 18 20  Temp: 36.8 C   SpO2: 95% 96%    Last Pain:  Vitals:   10/30/18 1856  TempSrc: Temporal  PainSc: 3                  KEPHART,WILLIAM K

## 2018-10-31 ENCOUNTER — Encounter: Payer: Self-pay | Admitting: General Surgery

## 2018-11-01 LAB — SURGICAL PATHOLOGY

## 2018-11-02 ENCOUNTER — Telehealth: Payer: Self-pay | Admitting: General Practice

## 2018-11-02 NOTE — Telephone Encounter (Signed)
Pt called in for covid results.   Advised of NEGATIVE result.   Pt would like to have results mailed to him at the address on file.

## 2019-04-27 IMAGING — CT CT HEAD W/O CM
3 series · 15 of 46 positions shown, 18 images · non-contrast
Comparison: None.

CLINICAL DATA: Headache and left visual disturbance after hitting
his head on a metal Jumper. Left forehead redness.

EXAM:
CT HEAD WITHOUT CONTRAST
TECHNIQUE: Contiguous axial images were obtained from the base of the skull
through the vertex without intravenous contrast.

[Series 2: head wo · axial · 0.43mm/px · z∈[-98,+22]mm · 9 of 29 slices shown, 12 images]
[im 3/29  brain]
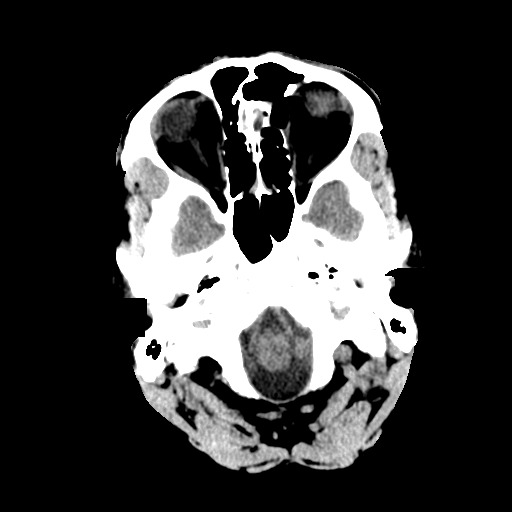
[im 3/29  bone]
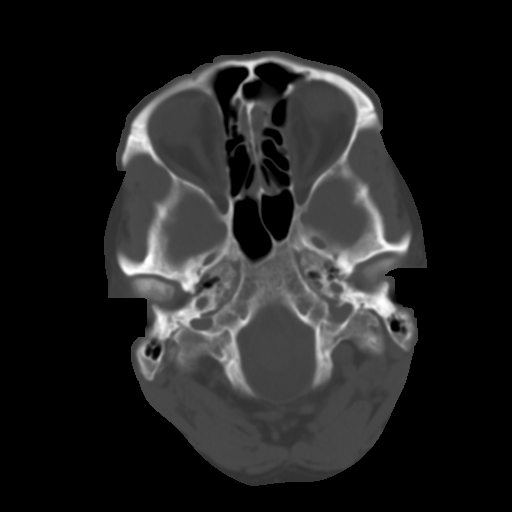
[im 6/29  brain]
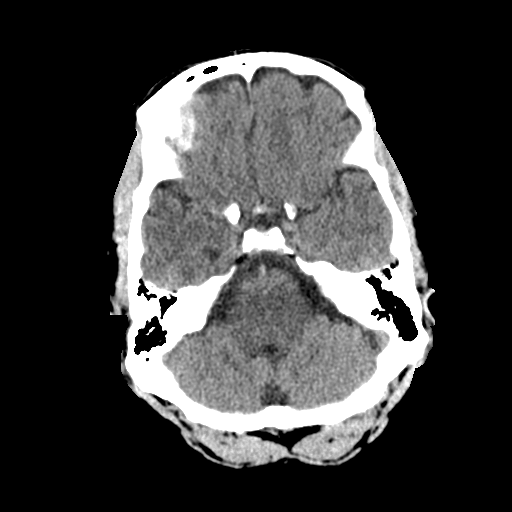
[im 9/29  brain]
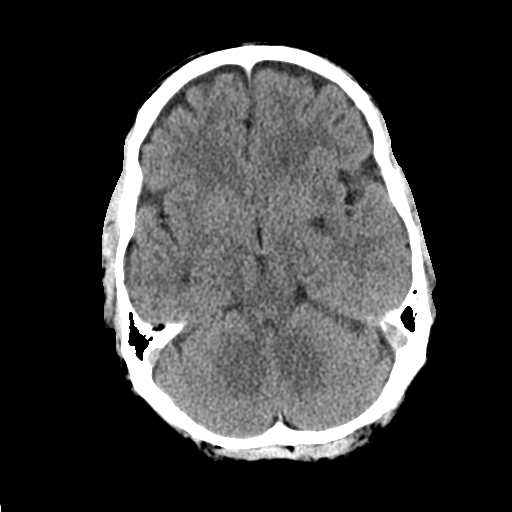
[im 12/29  brain]
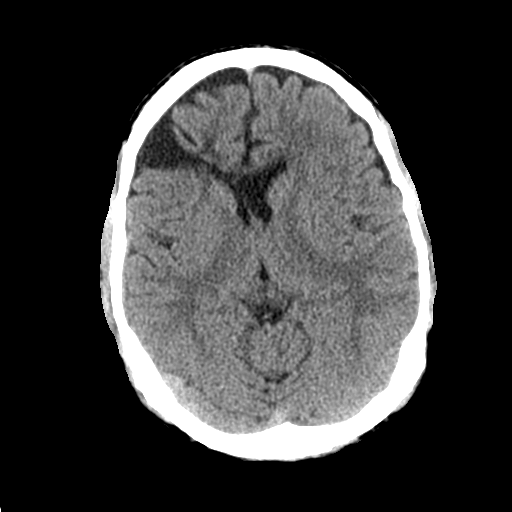
[im 15/29  brain]
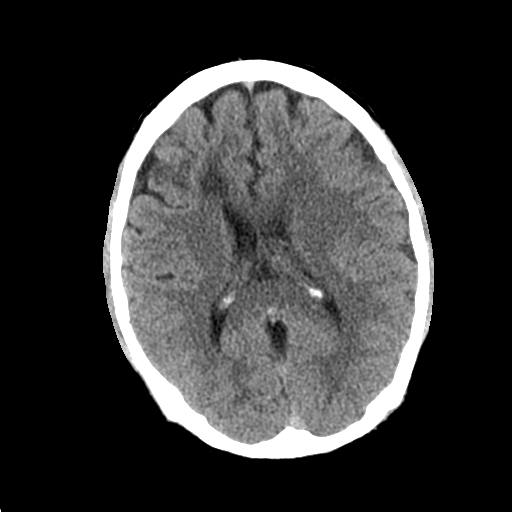
[im 15/29  bone]
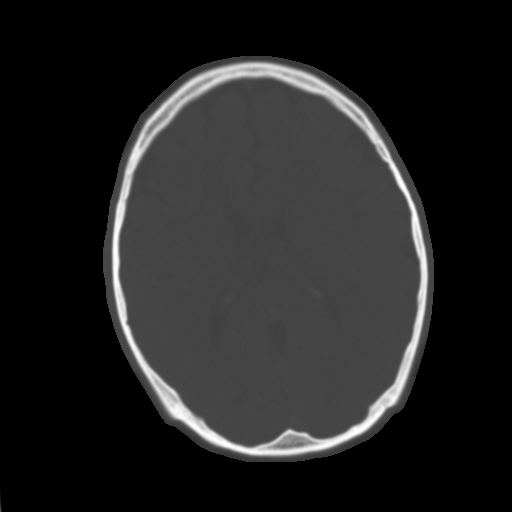
[im 18/29  brain]
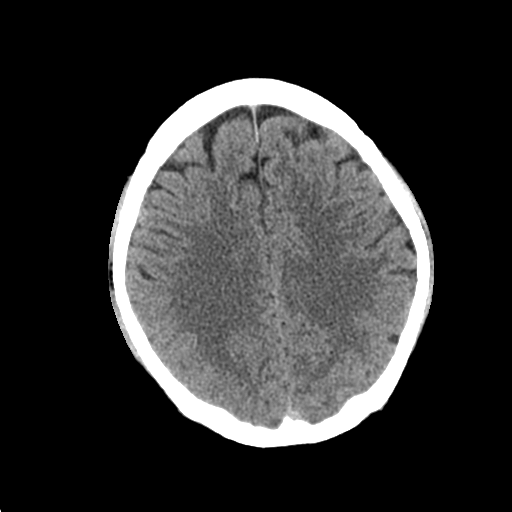
[im 21/29  brain]
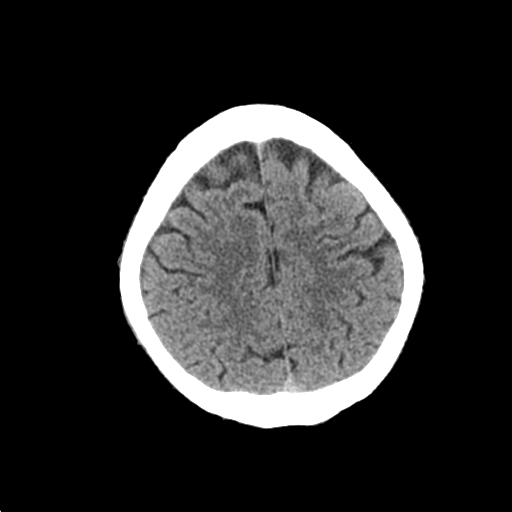
[im 24/29  brain]
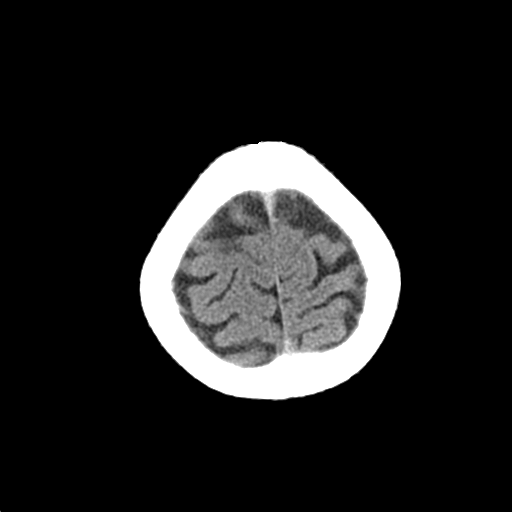
[im 27/29  brain]
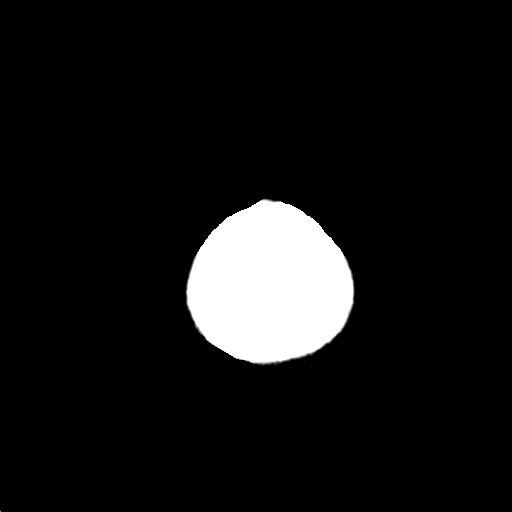
[im 27/29  bone]
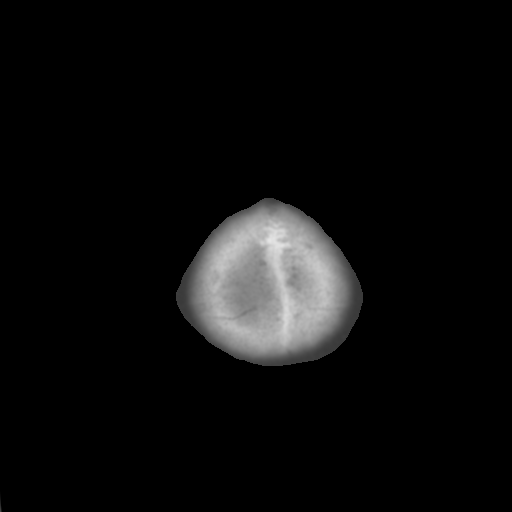

[Series 4: coronal soft tissue · coronal · 0.29mm/px · 3 of 64 slices shown]
[im 22/64  brain]
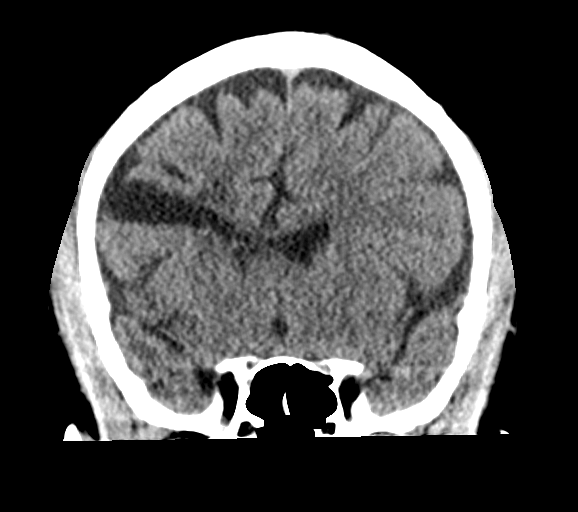
[im 29/64  brain]
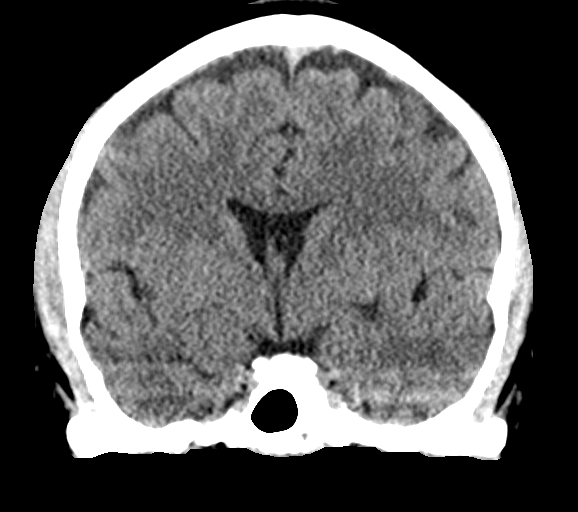
[im 36/64  brain]
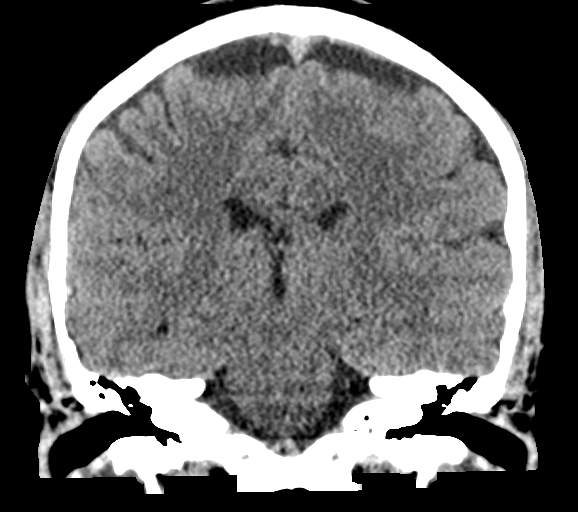

[Series 5: sagittal soft tissue · sagittal · 0.31mm/px · 3 of 56 slices shown]
[im 19/56  brain]
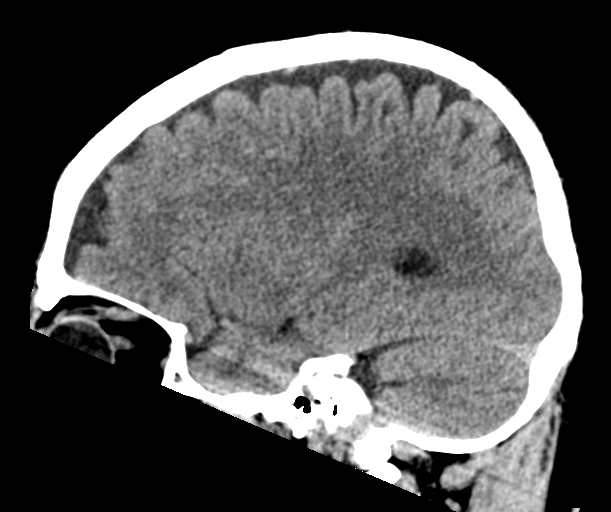
[im 28/56  brain]
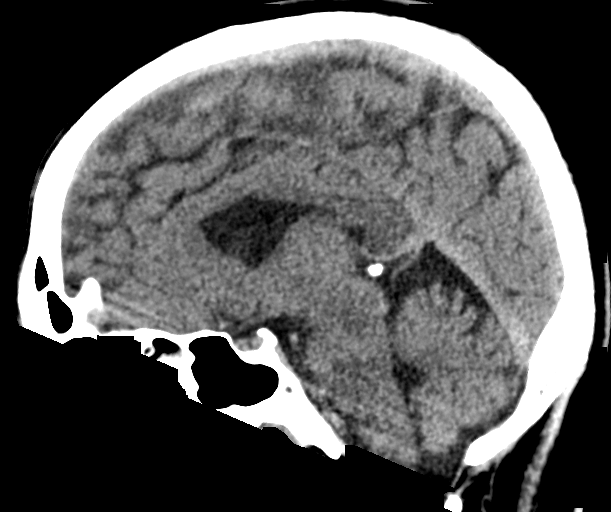
[im 37/56  brain]
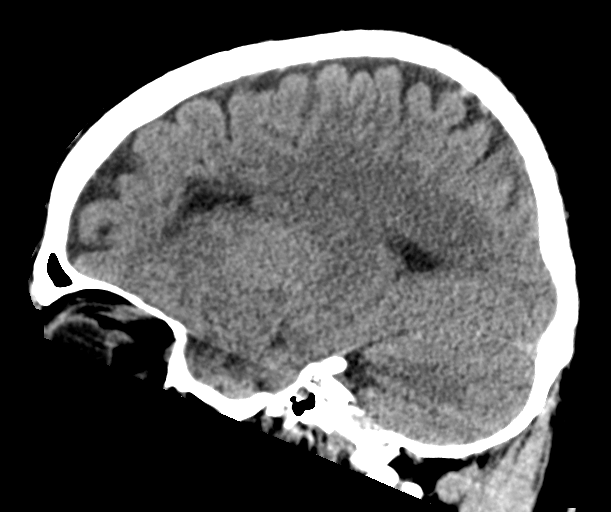

[15 of 46 positions shown; findings below may reference images not displayed]

FINDINGS: Brain: CSF filled cleft in the right frontal lobe extending to the
frontal horn of the left lateral ventricle with some tenting of the
ventricle toward the cleft. There's also a patchy low density in the
adjacent right frontal white matter. The ventricles are normal in
size. No intracranial hemorrhage, mass lesion or CT evidence of
acute infarction.

Vascular: No hyperdense vessel or unexpected calcification.

Skull: Normal. Negative for fracture or focal lesion.

Sinuses/Orbits: Mild bilateral ethmoid, inferior frontal and
anterior sphenoid sinus mucosal thickening.

Other: None.
IMPRESSION: 1. No acute abnormality.
2. Right frontal schizencephaly with encephalomalacia involving the
adjacent right frontal lobe white matter.
3. Mild chronic bilateral ethmoid, inferior frontal and anterior
sphenoid sinusitis.

## 2019-07-10 ENCOUNTER — Emergency Department: Payer: Worker's Compensation

## 2019-07-10 ENCOUNTER — Other Ambulatory Visit: Payer: Self-pay

## 2019-07-10 ENCOUNTER — Emergency Department
Admission: EM | Admit: 2019-07-10 | Discharge: 2019-07-10 | Disposition: A | Discharge status: Home / Self Care | Payer: Self-pay | Attending: Emergency Medicine | Admitting: Emergency Medicine

## 2019-07-10 ENCOUNTER — Encounter: Payer: Self-pay | Admitting: Emergency Medicine

## 2019-07-10 DIAGNOSIS — X500XXA Overexertion from strenuous movement or load, initial encounter: Secondary | ICD-10-CM | POA: Insufficient documentation

## 2019-07-10 DIAGNOSIS — Y990 Civilian activity done for income or pay: Secondary | ICD-10-CM | POA: Insufficient documentation

## 2019-07-10 DIAGNOSIS — Z79899 Other long term (current) drug therapy: Secondary | ICD-10-CM | POA: Insufficient documentation

## 2019-07-10 DIAGNOSIS — Y9389 Activity, other specified: Secondary | ICD-10-CM | POA: Insufficient documentation

## 2019-07-10 DIAGNOSIS — Y9289 Other specified places as the place of occurrence of the external cause: Secondary | ICD-10-CM | POA: Insufficient documentation

## 2019-07-10 DIAGNOSIS — S46911A Strain of unspecified muscle, fascia and tendon at shoulder and upper arm level, right arm, initial encounter: Secondary | ICD-10-CM | POA: Insufficient documentation

## 2019-07-10 DIAGNOSIS — F17210 Nicotine dependence, cigarettes, uncomplicated: Secondary | ICD-10-CM | POA: Insufficient documentation

## 2019-07-10 MED ORDER — NAPROXEN 500 MG PO TABS: 500.0000 mg | ORAL_TABLET | Freq: Two times a day (BID) | ORAL | 0 refills | Status: AC

## 2019-07-10 MED ORDER — KETOROLAC TROMETHAMINE 30 MG/ML IJ SOLN
30.0000 mg | Freq: Once | INTRAMUSCULAR | Status: AC
  Administered 2019-07-10: 30 mg via INTRAMUSCULAR
  Filled 2019-07-10: qty 1

## 2019-07-10 MED ORDER — TRAMADOL HCL 50 MG PO TABS: 50.0000 mg | ORAL_TABLET | Freq: Four times a day (QID) | ORAL | 0 refills | Status: DC | PRN

## 2019-07-10 NOTE — Discharge Instructions (Signed)
Take naproxen 500 mg twice daily with food every day.  The tramadol is the medication that we talked to you about that may cause drowsiness.  Do not take this medication while driving or operating machinery.  Wear the sling for support and protection for the next 2 to 3 days.  If not improving you will need to see Dr. Joice Lofts.

## 2019-07-10 NOTE — ED Provider Notes (Signed)
[facility] Emergency Department Provider Note  ____________________________________________   First MD Initiated Contact with Patient 07/10/19 [phone]     (approximate)  I have reviewed the triage vital signs and the nursing notes.   HISTORY  Chief Complaint Shoulder Pain Per Spanish interpreter   HPI Clinton Cole is a 54 y.o. male presents with complaint of right shoulder pain.  Patient was lifting a 20 pound box over his head while at work and felt his right shoulder "pop".  Patient states that he has had difficulty with range of motion in his right arm since that time.  He denies any previous shoulder dislocations or shoulder injuries in talking with the interpreter.  He rates his pain as a 10/10.       Past Medical History:  Diagnosis Date  . Hernia, inguinal     There are no problems to display for this patient.   Past Surgical History:  Procedure Laterality Date  . HERNIA REPAIR Left 2001  . INGUINAL HERNIA REPAIR Left 10/30/2018   Procedure: LAPAROSCOPIC INGUINAL HERNIA;  Surgeon: Carolan Shiver, MD;  Location: [facility] ORS;  Service: General;  Laterality: Left;    Prior to Admission medications   Medication Sig Start Date End Date Taking? Authorizing Provider  acetaminophen (TYLENOL) 325 MG tablet Take 975 mg by mouth every 6 (six) hours as needed for moderate pain.    [provider]  ibuprofen (ADVIL,MOTRIN) 600 MG tablet Take 1 tablet (600 mg total) by mouth every 8 (eight) hours as needed. 01/30/18   Tommi Rumps, PA-C  naproxen (NAPROSYN) 500 MG tablet Take 1 tablet (500 mg total) by mouth 2 (two) times daily with a meal. 07/10/19   Tommi Rumps, PA-C  traMADol (ULTRAM) 50 MG tablet Take 1 tablet (50 mg total) by mouth every 6 (six) hours as needed. 07/10/19   Tommi Rumps, PA-C    Allergies Patient has no known allergies.  No family history on file.  Social History Social History   Tobacco Use    . Smoking status: Current Some Day Smoker    Years: 20.00    Types: Cigarettes  . Smokeless tobacco: Never Used  . Tobacco comment: 1-2 cigarettes weekly  Substance Use Topics  . Alcohol use: No  . Drug use: Never    Review of Systems Constitutional: No fever/chills Eyes: No visual changes. Cardiovascular: Denies chest pain. Respiratory: Denies shortness of breath. Musculoskeletal: Positive for right shoulder pain. Skin: Negative for rash. Neurological: Negative for headaches, focal weakness or numbness.  ____________________________________________   PHYSICAL EXAM:  VITAL SIGNS: ED Triage Vitals  Enc Vitals Group     BP 07/10/19 0658 (!) 158/97     Pulse Rate 07/10/19 0658 86     Resp 07/10/19 0658 18     Temp 07/10/19 0658 98.9 F (37.2 C)     Temp Source 07/10/19 0658 Oral     SpO2 07/10/19 0658 98 %     Weight 07/10/19 0659 160 lb (72.6 kg)     Height 07/10/19 0659 5' 5.75" (1.67 m)     Head Circumference --      Peak Flow --      Pain Score 07/10/19 0659 10     Pain Loc --      Pain Edu? --      Excl. in GC? --     Constitutional: Alert and oriented. Well appearing and in no acute distress. Eyes: Conjunctivae are normal.  Head: Atraumatic. Neck: No stridor.   Cardiovascular: Normal rate, regular rhythm. Grossly normal heart sounds.  Good peripheral circulation. Respiratory: Normal respiratory effort.  No retractions. Lungs CTAB. Musculoskeletal: On examination of the right shoulder there is no gross deformity however there is moderate tenderness diffusely on palpation.  Increased tenderness with anterior AC joint palpation.  Patient is able to lift his arm to take his shirt off.  Range of motion is slow and guarded secondary to discomfort.  No deformity noted.  Skin is intact.  Pulses present. Neurologic:  Normal speech and language. No gross focal neurologic deficits are appreciated.  Skin:  Skin is warm, dry and intact. No rash noted. Psychiatric: Mood  and affect are normal. Speech and behavior are normal.  ____________________________________________   LABS (all labs ordered are listed, but only abnormal results are displayed)  Labs Reviewed - No data to display RADIOLOGY   Official radiology report(s): DG Shoulder Right  Result Date: 07/10/2019 CLINICAL DATA:  Injured shoulder at work lifting heavy boxes. EXAM: RIGHT SHOULDER - 2+ VIEW COMPARISON:  None. FINDINGS: The glenohumeral and AC joints are intact. Mild degenerative changes are noted. No acute fracture. The visualized ribs are intact and the visualized right lung is clear. IMPRESSION: Mild degenerative changes but no acute bony findings. Electronically Signed   By: Rudie Meyer M.D.   On: 07/10/2019 07:41    ____________________________________________   PROCEDURES  Procedure(s) performed (including Critical Care):  Procedures Sling was applied by nursing staff  ____________________________________________   INITIAL IMPRESSION / ASSESSMENT AND PLAN / ED COURSE  As part of my medical decision making, I reviewed the following data within the electronic MEDICAL RECORD NUMBER Notes from prior ED visits and [place] Controlled Substance Database  54 year old male presents to the ED for a Workmen's Comp. injury that occurred at [employer].  Patient states that he was lifting a box over his head that weighed approximately 20 pounds when he felt his shoulder "pop".  He tells the interpreter that there is been no past history of shoulder dislocations.  He complains of increased pain with range of motion.  Physical exam was unremarkable with the exception of decreased range of motion which exacerbates his pain.  Patient is x-rays were negative for dislocation but did show some degenerative arthritis.  Patient was placed in a sling that he is to wear for the next 2 to 3 days.  Ice and elevation when possible.  A prescription for medication was sent to his pharmacy.  The Naprosyn he is to  take twice a day for inflammation.  He is aware that the tramadol is every 6 hours and should not be taken while he was driving.  Note for work was written.  He is to follow-up with his PCP or Dr. Joice Lofts if any continued problems.  ____________________________________________   FINAL CLINICAL IMPRESSION(S) / ED DIAGNOSES  Final diagnoses:  Shoulder strain, right, initial encounter     ED Discharge Orders         Ordered    naproxen (NAPROSYN) 500 MG tablet  2 times daily with meals     07/10/19 0841    traMADol (ULTRAM) 50 MG tablet  Every 6 hours PRN     07/10/19 0841           Note:  This document was prepared using Dragon voice recognition software and may include unintentional dictation errors.    Tommi Rumps, PA-C 07/10/19 1210    Emily Filbert,  MD 07/10/19 1358

## 2019-07-10 NOTE — ED Notes (Signed)
Pt states he was at work, while lifting a box over his head he felt his right shoulder pop and is not able to abduct or rotate he right shoulder/arm.

## 2019-07-10 NOTE — ED Notes (Signed)
Per [employer] WC Profile, urine drug screen is required. Urine drug screen collected and consent forms completed with interpreter present. Sealed specimen taken to lab by this tech.

## 2019-07-10 NOTE — ED Triage Notes (Signed)
Pt presents to ED with right shoulder pain after he injured it at work. Pt states he was lifting heavy boxes and stacking them and he felt a crack and now cant lift his arm. Pt states the pain is sharp.

## 2020-01-08 IMAGING — CT CT ABDOMEN AND PELVIS WITH CONTRAST
2 of 8 series · 13 of 46 positions shown, 18 images · IV contrast (APPLIED)
Comparison: None.

CLINICAL DATA: Left groin hernia

EXAM:
CT ABDOMEN AND PELVIS WITH CONTRAST
TECHNIQUE: Multidetector CT imaging of the abdomen and pelvis was performed
using the standard protocol following bolus administration of
intravenous contrast.
CONTRAST:  100mL OMNIPAQUE IOHEXOL 300 MG/ML  SOLN

[Series 2: routine abd/pel with · axial · 0.74mm/px · z∈[-903,-483]mm · 10 of 102 slices shown, 15 images]
[im 9/102  soft-tissue]
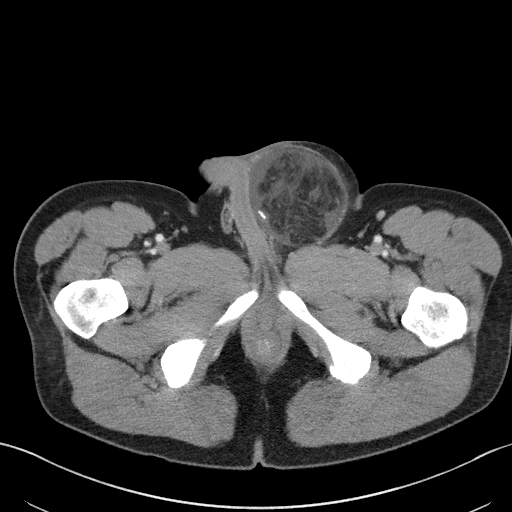
[im 9/102  bone]
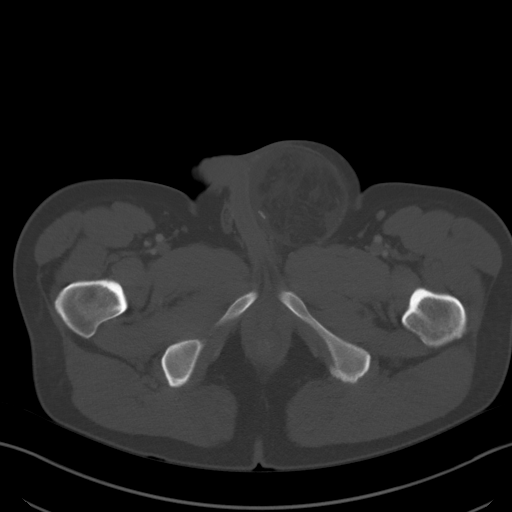
[im 17/102  soft-tissue]
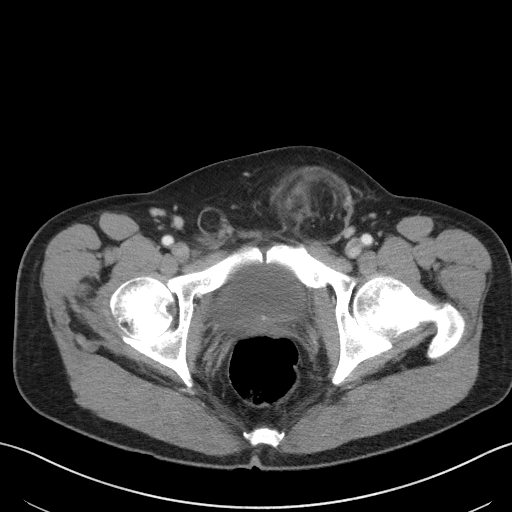
[im 34/102  soft-tissue]
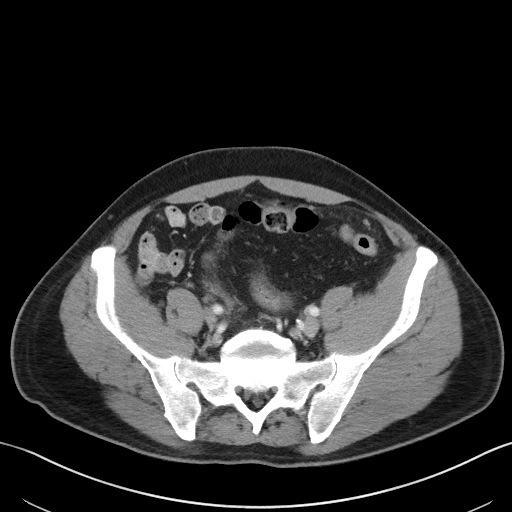
[im 43/102  soft-tissue]
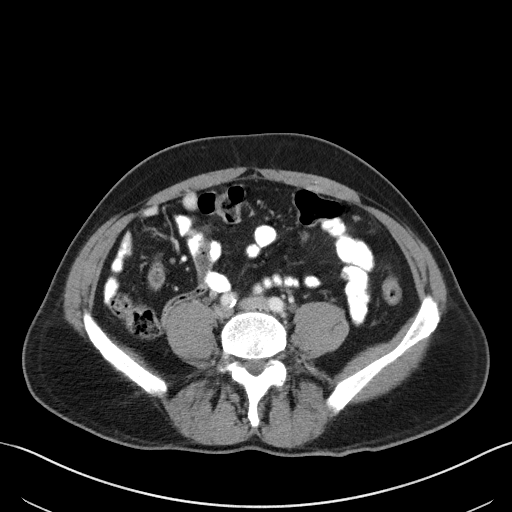
[im 51/102  soft-tissue]
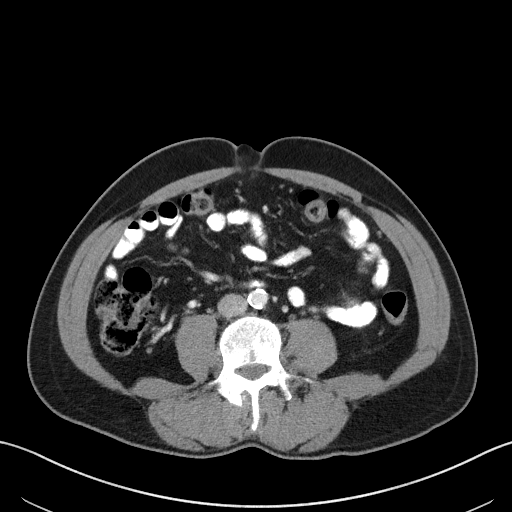
[im 59/102  soft-tissue]
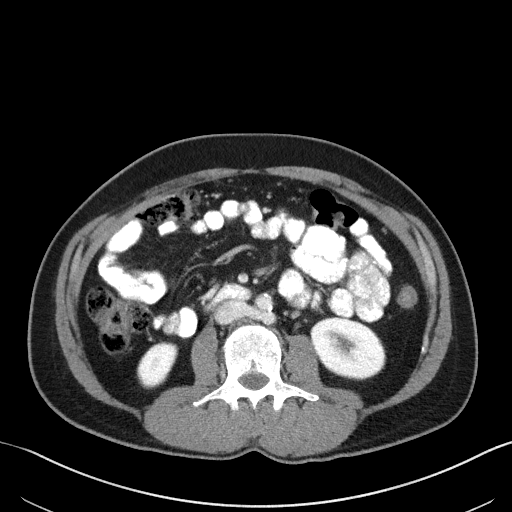
[im 68/102  soft-tissue]
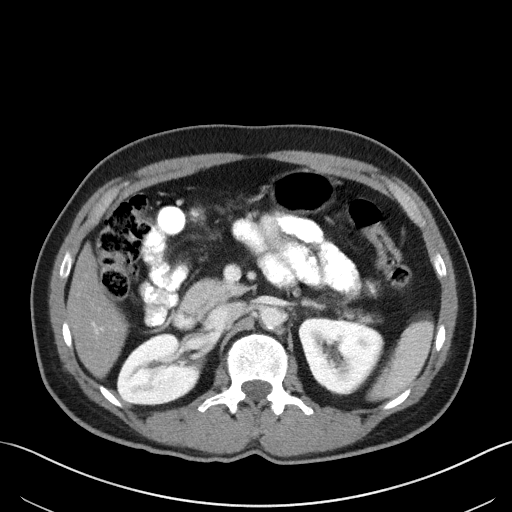
[im 68/102  lung]
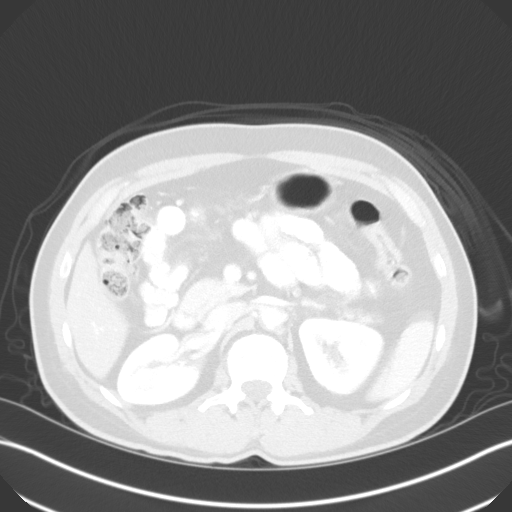
[im 76/102  lung]
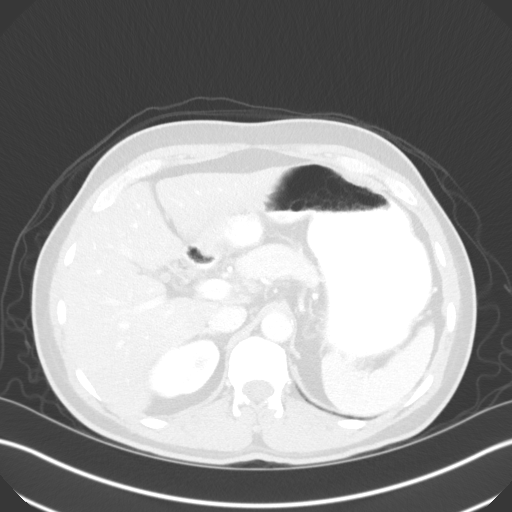
[im 85/102  soft-tissue]
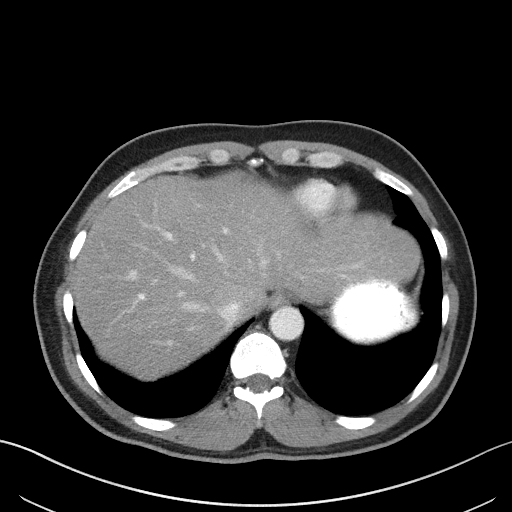
[im 85/102  lung]
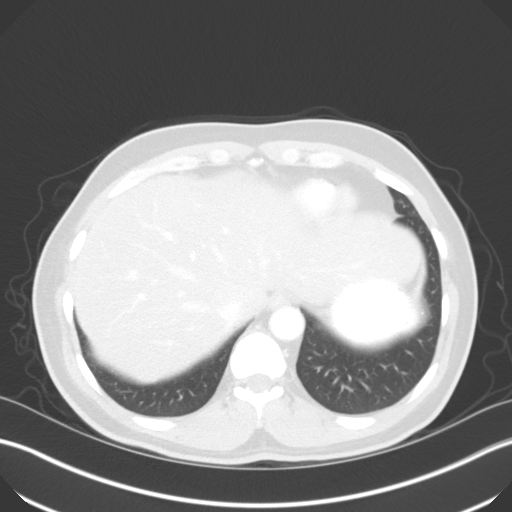
[im 93/102  soft-tissue]
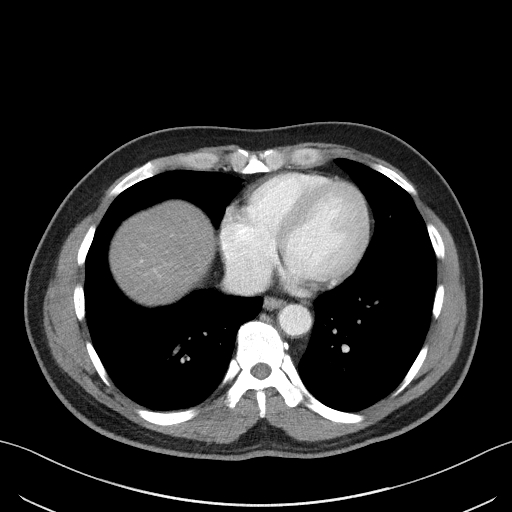
[im 93/102  lung]
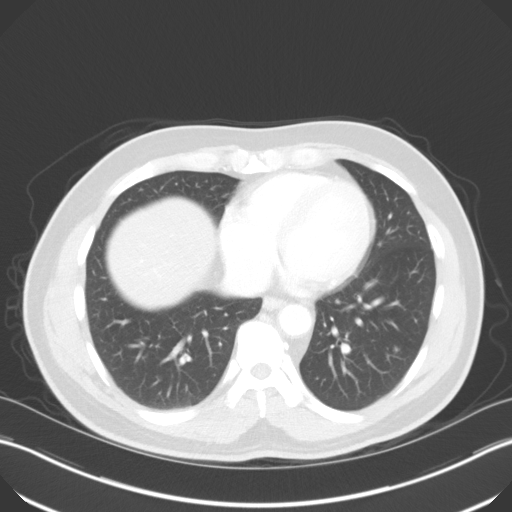
[im 93/102  bone]
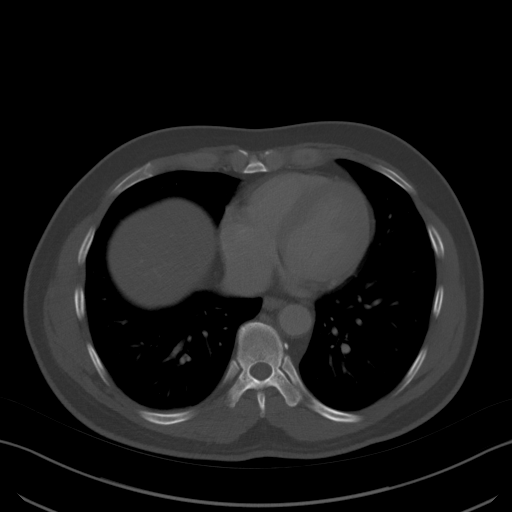

[Series 8: coronal st · coronal · 0.71mm/px · 3 of 84 slices shown]
[im 21/84  soft-tissue]
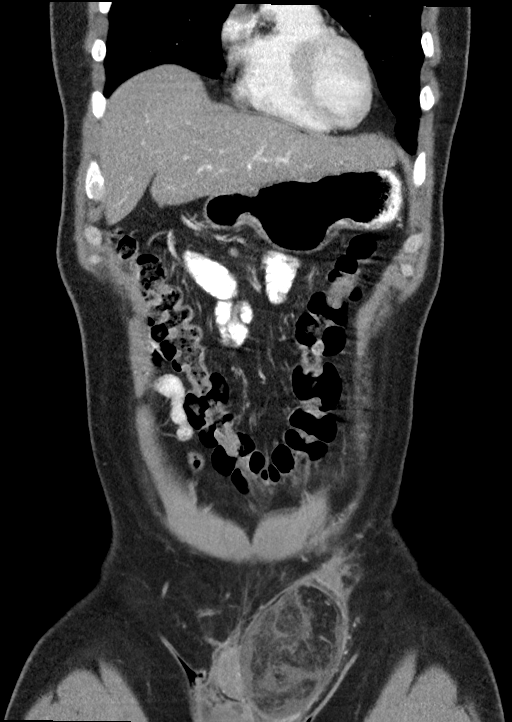
[im 42/84  soft-tissue]
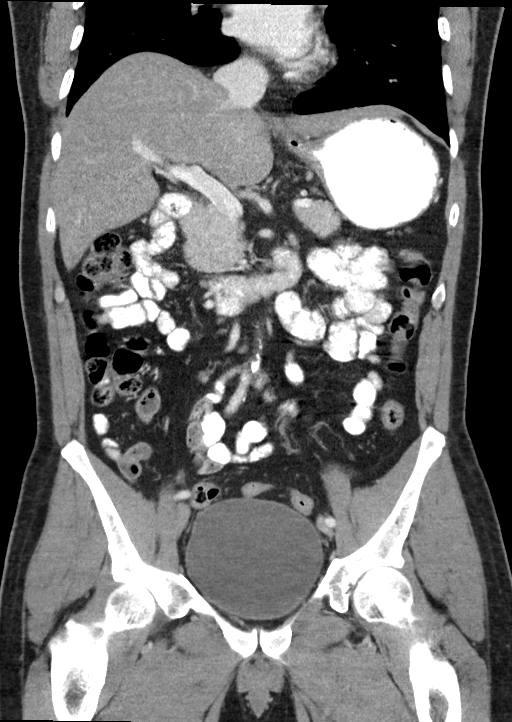
[im 63/84  soft-tissue]
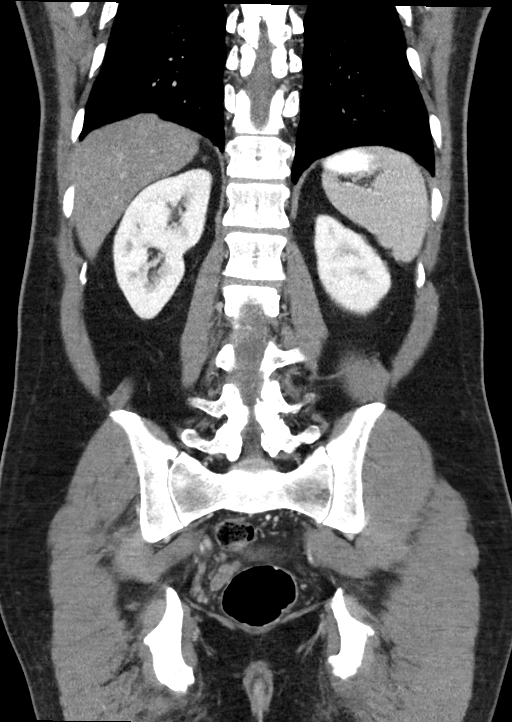

[13 of 46 positions shown; findings below may reference images not displayed]

FINDINGS: Lower chest: Lung bases are clear.

Hepatobiliary: Liver is within normal limits.

Gallbladder is unremarkable. No intrahepatic or extrahepatic ductal
dilatation.

Pancreas: Within normal limits.

Spleen: Within normal limits.

Adrenals/Urinary Tract: Adrenal glands are within normal limits.

Kidneys are within normal limits.  No hydronephrosis.

Bladder is within normal limits.

Stomach/Bowel: Stomach is normal limits.

No evidence of bowel obstruction.

Normal appendix (series 2/image 60).

No colonic wall thickening or inflammatory changes.

Vascular/Lymphatic: No evidence of abdominal aortic aneurysm.

Atherosclerotic calcifications of the abdominal aorta and branch
vessels.

No suspicious abdominopelvic lymphadenopathy.

Reproductive: Prostate is unremarkable.

Other: Large left inguinal/scrotal hernia containing fat with fluid
and inflammatory stranding (series 2/image 90), corresponding to the
sonographic abnormality. No associated bowel within the hernia.

Additional tiny fat containing right inguinal hernia (series 2/image
87).

Musculoskeletal: Mild degenerative changes of the visualized
thoracolumbar spine.
IMPRESSION: Large left inguinal/scrotal hernia containing fat with fluid and
inflammatory stranding, corresponding to the sonographic
abnormality. No associated bowel within the hernia.

Additional tiny fat containing right inguinal hernia.

No evidence of bowel obstruction.  Normal appendix.

## 2020-04-17 ENCOUNTER — Encounter: Payer: Self-pay | Admitting: Emergency Medicine

## 2020-04-17 ENCOUNTER — Other Ambulatory Visit: Payer: Self-pay

## 2020-04-17 ENCOUNTER — Observation Stay
Admission: EM | Admit: 2020-04-17 | Discharge: 2020-04-18 | Disposition: A | Discharge status: Home / Self Care | Payer: Self-pay | Attending: Internal Medicine | Admitting: Internal Medicine

## 2020-04-17 ENCOUNTER — Emergency Department: Payer: Self-pay

## 2020-04-17 DIAGNOSIS — R1032 Left lower quadrant pain: Secondary | ICD-10-CM | POA: Insufficient documentation

## 2020-04-17 DIAGNOSIS — E1310 Other specified diabetes mellitus with ketoacidosis without coma: Secondary | ICD-10-CM

## 2020-04-17 DIAGNOSIS — Z20822 Contact with and (suspected) exposure to covid-19: Secondary | ICD-10-CM | POA: Insufficient documentation

## 2020-04-17 DIAGNOSIS — E1110 Type 2 diabetes mellitus with ketoacidosis without coma: Principal | ICD-10-CM | POA: Diagnosis present

## 2020-04-17 DIAGNOSIS — F17210 Nicotine dependence, cigarettes, uncomplicated: Secondary | ICD-10-CM | POA: Insufficient documentation

## 2020-04-17 DIAGNOSIS — N179 Acute kidney failure, unspecified: Secondary | ICD-10-CM

## 2020-04-17 LAB — CBG MONITORING, ED
Glucose-Capillary: >600 mg/dL (ref 70–99)
Glucose-Capillary: >600 mg/dL (ref 70–99)
Glucose-Capillary: >600 mg/dL (ref 70–99)
Glucose-Capillary: 361 mg/dL — ABNORMAL HIGH (ref 70–99)
Glucose-Capillary: 537 mg/dL (ref 70–99)

## 2020-04-17 LAB — BASIC METABOLIC PANEL WITH GFR
Anion gap: 15 (ref 5–15)
BUN: 14 mg/dL (ref 6–20)
CO2: 18 mmol/L — ABNORMAL LOW (ref 22–32)
Calcium: 9.2 mg/dL (ref 8.9–10.3)
Chloride: 106 mmol/L (ref 98–111)
Creatinine, Ser: 0.94 mg/dL (ref 0.61–1.24)
GFR, Estimated: >60 mL/min
Glucose, Bld: 339 mg/dL — ABNORMAL HIGH (ref 70–99)
Potassium: 4.1 mmol/L (ref 3.5–5.1)
Sodium: 139 mmol/L (ref 135–145)

## 2020-04-17 LAB — URINALYSIS, COMPLETE (UACMP) WITH MICROSCOPIC
Bacteria, UA: NONE SEEN
Bilirubin Urine: NEGATIVE
Glucose, UA: >=500 mg/dL — AB
Hgb urine dipstick: NEGATIVE
Ketones, ur: 20 mg/dL — AB
Leukocytes,Ua: NEGATIVE
Nitrite: NEGATIVE
Protein, ur: NEGATIVE mg/dL
Specific Gravity, Urine: 1.029 (ref 1.005–1.030)
Squamous Epithelial / HPF: NONE SEEN (ref 0–5)
pH: 5 (ref 5.0–8.0)

## 2020-04-17 LAB — CBC
HCT: 50 % (ref 39.0–52.0)
Hemoglobin: 16.2 g/dL (ref 13.0–17.0)
MCH: 30 pg (ref 26.0–34.0)
MCHC: 32.4 g/dL (ref 30.0–36.0)
MCV: 92.6 fL (ref 80.0–100.0)
Platelets: 205 10*3/uL (ref 150–400)
RBC: 5.4 MIL/uL (ref 4.22–5.81)
RDW: 12.6 % (ref 11.5–15.5)
WBC: 6.8 10*3/uL (ref 4.0–10.5)
nRBC: 0 % (ref 0.0–0.2)

## 2020-04-17 LAB — GLUCOSE, CAPILLARY
Glucose-Capillary: 165 mg/dL — ABNORMAL HIGH (ref 70–99)
Glucose-Capillary: 171 mg/dL — ABNORMAL HIGH (ref 70–99)
Glucose-Capillary: 182 mg/dL — ABNORMAL HIGH (ref 70–99)

## 2020-04-17 LAB — BASIC METABOLIC PANEL
Anion gap: 20 — ABNORMAL HIGH (ref 5–15)
BUN: 18 mg/dL (ref 6–20)
CO2: 19 mmol/L — ABNORMAL LOW (ref 22–32)
Calcium: 9.5 mg/dL (ref 8.9–10.3)
Chloride: 91 mmol/L — ABNORMAL LOW (ref 98–111)
Creatinine, Ser: 1.16 mg/dL (ref 0.61–1.24)
GFR, Estimated: >60 mL/min (ref >60)
Glucose, Bld: 942 mg/dL (ref 70–99)
Potassium: 5.1 mmol/L (ref 3.5–5.1)
Sodium: 130 mmol/L — ABNORMAL LOW (ref 135–145)

## 2020-04-17 LAB — RESP PANEL BY RT-PCR (FLU A&B, COVID) ARPGX2
Influenza A by PCR: NEGATIVE
Influenza B by PCR: NEGATIVE
SARS Coronavirus 2 by RT PCR: NEGATIVE

## 2020-04-17 LAB — BETA-HYDROXYBUTYRIC ACID: Beta-Hydroxybutyric Acid: 6.55 mmol/L — ABNORMAL HIGH (ref 0.05–0.27)

## 2020-04-17 MED ORDER — IOHEXOL 300 MG/ML  SOLN: 100.0000 mL | Freq: Once | INTRAMUSCULAR | Status: DC | PRN

## 2020-04-17 MED ORDER — ENOXAPARIN SODIUM 30 MG/0.3ML ~~LOC~~ SOLN
30.0000 mg | Freq: Two times a day (BID) | SUBCUTANEOUS | Status: DC
  Administered 2020-04-17: 30 mg via SUBCUTANEOUS
  Filled 2020-04-17 (×2): qty 0.3

## 2020-04-17 MED ORDER — SODIUM CHLORIDE 0.9 % IV BOLUS
1000.0000 mL | Freq: Once | INTRAVENOUS | Status: AC
  Administered 2020-04-17: 1000 mL via INTRAVENOUS

## 2020-04-17 MED ORDER — DEXTROSE IN LACTATED RINGERS 5 % IV SOLN: INTRAVENOUS | Status: DC

## 2020-04-17 MED ORDER — MORPHINE SULFATE (PF) 2 MG/ML IV SOLN
1.0000 mg | INTRAVENOUS | Status: DC | PRN
  Administered 2020-04-17 – 2020-04-18 (×3): 1 mg via INTRAVENOUS
  Filled 2020-04-17 (×3): qty 1

## 2020-04-17 MED ORDER — LACTATED RINGERS IV BOLUS
1000.0000 mL | Freq: Once | INTRAVENOUS | Status: AC
  Administered 2020-04-17: 1000 mL via INTRAVENOUS

## 2020-04-17 MED ORDER — DEXTROSE 50 % IV SOLN: 0.0000 mL | INTRAVENOUS | Status: DC | PRN

## 2020-04-17 MED ORDER — IOHEXOL 300 MG/ML  SOLN
100.0000 mL | Freq: Once | INTRAMUSCULAR | Status: AC | PRN
  Administered 2020-04-17: 100 mL via INTRAVENOUS

## 2020-04-17 MED ORDER — INSULIN REGULAR(HUMAN) IN NACL 100-0.9 UT/100ML-% IV SOLN
INTRAVENOUS | Status: DC
  Administered 2020-04-17: 12 [IU]/h via INTRAVENOUS
  Filled 2020-04-17: qty 100

## 2020-04-17 MED ORDER — LACTATED RINGERS IV SOLN: INTRAVENOUS | Status: DC

## 2020-04-17 NOTE — ED Triage Notes (Signed)
Pt to ED via POV, pt states that this doctor from [facility] called him and told him that he needed to come to the ED because his blood sugar was too high. Pt states that he has been having increased thirst, dry mouth, and weakness for the past month. Pt states that he is also having abdominal pain. Pt was able to have BM today. Pt is currently in NAD.

## 2020-04-17 NOTE — ED Provider Notes (Signed)
[facility] Emergency Department Provider Note  ____________________________________________  Time seen: Approximately 6:15 PM  I have reviewed the triage vital signs and the nursing notes.   HISTORY  Chief Complaint Hyperglycemia    HPI Clinton Cole is a 55 y.o. male with past history of left inguinal hernia repair who is coming to the ED complaining of elevated blood sugar first discovered at his primary care office.  No history of diabetes.  He has been having polyuria and polydipsia and fatigue for the past month.  Also has some left lower quadrant abdominal pain.  Symptoms are constant, no aggravating or alleviating factors.  No fever, no vomiting, no confusion or falls.      Past Medical History:  Diagnosis Date  . Hernia, inguinal      Patient Active Problem List   Diagnosis Date Noted  . DKA (diabetic ketoacidosis) (HCC) 04/17/2020  . AKI (acute kidney injury) (HCC) 04/17/2020     Past Surgical History:  Procedure Laterality Date  . HERNIA REPAIR Left 2001  . INGUINAL HERNIA REPAIR Left 10/30/2018   Procedure: LAPAROSCOPIC INGUINAL HERNIA;  Surgeon: Carolan Shiver, MD;  Location: [facility] ORS;  Service: General;  Laterality: Left;     Prior to Admission medications   Medication Sig Start Date End Date Taking? Authorizing Provider  acetaminophen (TYLENOL) 325 MG tablet Take 975 mg by mouth every 6 (six) hours as needed for moderate pain.    [provider]  ibuprofen (ADVIL,MOTRIN) 600 MG tablet Take 1 tablet (600 mg total) by mouth every 8 (eight) hours as needed. 01/30/18   Tommi Rumps, PA-C  naproxen (NAPROSYN) 500 MG tablet Take 1 tablet (500 mg total) by mouth 2 (two) times daily with a meal. 07/10/19   Tommi Rumps, PA-C  traMADol (ULTRAM) 50 MG tablet Take 1 tablet (50 mg total) by mouth every 6 (six) hours as needed. 07/10/19   Tommi Rumps, PA-C     Allergies Patient has no known  allergies.   Family History  Problem Relation Age of Onset  . Diabetes Sibling   . Diabetes Sibling   . Diabetes Sibling     Social History Social History   Tobacco Use  . Smoking status: Current Some Day Smoker    Years: 20.00    Types: Cigarettes  . Smokeless tobacco: Never Used  . Tobacco comment: 1-2 cigarettes weekly  Vaping Use  . Vaping Use: Never used  Substance Use Topics  . Alcohol use: No  . Drug use: Never    Review of Systems  Constitutional:   No fever or chills.  ENT:   No sore throat. No rhinorrhea. Cardiovascular:   No chest pain or syncope. Respiratory:   No dyspnea or cough. Gastrointestinal:   Positive as above for abdominal pain without vomiting and diarrhea.  Musculoskeletal:   Negative for focal pain or swelling All other systems reviewed and are negative except as documented above in ROS and HPI.  ____________________________________________   PHYSICAL EXAM:  VITAL SIGNS: ED Triage Vitals  Enc Vitals Group     BP 04/17/20 1642 (!) 124/92     Pulse Rate 04/17/20 1642 (!) 113     Resp 04/17/20 1642 16     Temp 04/17/20 1642 98.4 F (36.9 C)     Temp Source 04/17/20 1642 Oral     SpO2 04/17/20 1642 100 %     Weight 04/17/20 1644 162 lb (73.5 kg)     Height  04/17/20 1644 5' 7.72" (1.72 m)     Head Circumference --      Peak Flow --      Pain Score 04/17/20 1644 10     Pain Loc --      Pain Edu? --      Excl. in GC? --     Vital signs reviewed, nursing assessments reviewed.   Constitutional:   Alert and oriented. Non-toxic appearance. Eyes:   Conjunctivae are normal. EOMI. PERRL. ENT      Head:   Normocephalic and atraumatic.      Nose: Normal      Mouth/Throat: Dry mucous membranes      Neck:   No meningismus. Full ROM. Hematological/Lymphatic/Immunilogical:   No cervical lymphadenopathy. Cardiovascular:   Tachycardia heart rate 110. Symmetric bilateral radial and DP pulses.  No murmurs. Cap refill less than 2  seconds. Respiratory:   Normal respiratory effort without tachypnea/retractions. Breath sounds are clear and equal bilaterally. No wheezes/rales/rhonchi. Gastrointestinal:   Soft with left lower quadrant tenderness.  No palpable hernia. Non distended. No rebound, rigidity, or guarding.  Musculoskeletal:   Normal range of motion in all extremities. No joint effusions.  No lower extremity tenderness.  No edema. Neurologic:   Normal speech and language.  Motor grossly intact. No acute focal neurologic deficits are appreciated.  Skin:    Skin is warm, dry and intact. No rash noted.  No petechiae, purpura, or bullae.  ____________________________________________    LABS (pertinent positives/negatives) (all labs ordered are listed, but only abnormal results are displayed) Labs Reviewed  BASIC METABOLIC PANEL - Abnormal; Notable for the following components:      Result Value   Sodium 130 (*)    Chloride 91 (*)    CO2 19 (*)    Glucose, Bld 942 (*)    Anion gap 20 (*)    All other components within normal limits  URINALYSIS, COMPLETE (UACMP) WITH MICROSCOPIC - Abnormal; Notable for the following components:   Color, Urine STRAW (*)    APPearance CLEAR (*)    Glucose, UA >=500 (*)    Ketones, ur 20 (*)    All other components within normal limits  CBG MONITORING, ED - Abnormal; Notable for the following components:   Glucose-Capillary >600 (*)    All other components within normal limits  CBG MONITORING, ED - Abnormal; Notable for the following components:   Glucose-Capillary >600 (*)    All other components within normal limits  CBG MONITORING, ED - Abnormal; Notable for the following components:   Glucose-Capillary >600 (*)    All other components within normal limits  CBG MONITORING, ED - Abnormal; Notable for the following components:   Glucose-Capillary 537 (*)    All other components within normal limits  CBG MONITORING, ED - Abnormal; Notable for the following components:    Glucose-Capillary 361 (*)    All other components within normal limits  RESP PANEL BY RT-PCR (FLU A&B, COVID) ARPGX2  CBC  HIV ANTIBODY (ROUTINE TESTING W REFLEX)  BASIC METABOLIC PANEL  BASIC METABOLIC PANEL  BASIC METABOLIC PANEL  BETA-HYDROXYBUTYRIC ACID  BETA-HYDROXYBUTYRIC ACID  HEMOGLOBIN A1C   ____________________________________________   EKG    ____________________________________________    RADIOLOGY  CT ABDOMEN PELVIS W CONTRAST  Result Date: 04/17/2020 CLINICAL DATA:  Bowel obstruction suspected. Hyperglycemia. Increased thirst. EXAM: CT ABDOMEN AND PELVIS WITH CONTRAST TECHNIQUE: Multidetector CT imaging of the abdomen and pelvis was performed using the standard protocol following bolus administration of  intravenous contrast. CONTRAST:  100mL OMNIPAQUE IOHEXOL 300 MG/ML  SOLN COMPARISON:  CT abdomen pelvis 10/13/2018 FINDINGS: Lower chest: No acute abnormality. Hepatobiliary: The hepatic parenchyma is diffusely hypodense compared to the splenic parenchyma consistent with fatty infiltration. Likely variant perfusion of the liver (2:16). Otherwise no focal liver abnormality. No gallstones, gallbladder wall thickening, or pericholecystic fluid. No biliary dilatation. Pancreas: No focal lesion. Normal pancreatic contour. No surrounding inflammatory changes. No main pancreatic ductal dilatation. Spleen: Normal in size without focal abnormality. Adrenals/Urinary Tract: No adrenal nodule bilaterally. Bilateral kidneys enhance symmetrically. No hydronephrosis. No hydroureter. The urinary bladder is unremarkable. On delayed imaging, there is no urothelial wall thickening and there are no filling defects in the opacified portions of the bilateral collecting systems or ureters. Stomach/Bowel: Stomach is within normal limits. No evidence of bowel wall thickening or dilatation. Appendix appears normal in caliber with no signs of inflammatory changes. An appendicoliths is noted at its base.  Vascular/Lymphatic: No abdominal aorta or iliac aneurysm. Severe calcified and noncalcified atherosclerotic plaque of the aorta and its branches. No abdominal, pelvic, or inguinal lymphadenopathy. Reproductive: Prostate is unremarkable. Other: No intraperitoneal free fluid. No intraperitoneal free gas. No organized fluid collection. Musculoskeletal: Surgical changes related to left inguinal hernia repair. No findings to suggest recurrent hernia. Tiny fat containing left inguinal hernia. No ventral hernia identified. No suspicious lytic or blastic osseous lesions. No acute displaced fracture. Multilevel degenerative changes of the spine. 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 IMPRESSION: 1. No bowel obstruction. No acute intra-abdominal intrapelvic abnormality. 2. Status post left inguinal hernia repair with no associated recurrence. 3. Stable tiny fat containing right inguinal hernia. 4. Hepatic steatosis. 5.  Aortic Atherosclerosis (ICD10-I70.0). Electronically Signed   By: Tish FredericksonMorgane  Naveau M.D.   On: 04/17/2020 18:29    ____________________________________________   PROCEDURES .Critical Care Performed by: Sharman CheekStafford, Chung Chagoya, MD Authorized by: Sharman CheekStafford, Shyniece Scripter, MD   Critical care provider statement:    Critical care time (minutes):  35   Critical care time was exclusive of:  Separately billable procedures and treating other patients   Critical care was necessary to treat or prevent imminent or life-threatening deterioration of the following conditions:  Metabolic crisis, endocrine crisis and dehydration   Critical care was time spent personally by me on the following activities:  Development of treatment plan with patient or surrogate, discussions with consultants, evaluation of patient's response to treatment, examination of patient, obtaining history from patient or surrogate, ordering and performing treatments and interventions, ordering and review of laboratory studies, ordering  and review of radiographic studies, pulse oximetry, re-evaluation of patient's condition and review of old charts    ____________________________________________    CLINICAL IMPRESSION / ASSESSMENT AND PLAN / ED COURSE  Medications ordered in the ED: Medications  insulin regular, human (MYXREDLIN) 100 units/ 100 mL infusion (5.5 Units/hr Intravenous Rate/Dose Change 04/17/20 1929)  lactated ringers infusion (0 mLs Intravenous Hold 04/17/20 1822)  dextrose 5 % in lactated ringers infusion (0 mLs Intravenous Hold 04/17/20 1822)  dextrose 50 % solution 0-50 mL (has no administration in time range)  enoxaparin (LOVENOX) injection 30 mg (has no administration in time range)  sodium chloride 0.9 % bolus 1,000 mL (1,000 mLs Intravenous New Bag/Given 04/17/20 1819)  lactated ringers bolus 1,000 mL (0 mLs Intravenous Stopped 04/17/20 1936)  iohexol (OMNIPAQUE) 300 MG/ML solution 100 mL (100 mLs Intravenous Contrast Given 04/17/20 1800)    Pertinent labs & imaging results that were available during my care of the patient  were reviewed by me and considered in my medical decision making (see chart for details).  Whitt Auletta Zeledon was evaluated in Emergency Department on 04/17/2020 for the symptoms described in the history of present illness. He was evaluated in the context of the global COVID-19 pandemic, which necessitated consideration that the patient might be at risk for infection with the SARS-CoV-2 virus that causes COVID-19. Institutional protocols and algorithms that pertain to the evaluation of patients at risk for COVID-19 are in a state of rapid change based on information released by regulatory bodies including the CDC and federal and state organizations. These policies and algorithms were followed during the patient's care in the ED.   Patient presents with fatigue, tachycardia, report of hyperglycemia.  Fingerstick blood sugar at triage was greater than 600.  Patient given IV fluids for  hydration.  Labs show a glucose of 900, metabolic acidosis with elevated anion gap, ketosis on urinalysis, indicative of DKA with new onset diabetes.  Will start insulin infusion.  CT scan obtained due to abdominal pain and history of inguinal hernia to look for SBO, incarcerated hernia, diverticulitis, constipation, ileus.  CT scan is unremarkable.  Mental status is normal.  Case discussed with hospitalist for further management.      ____________________________________________   FINAL CLINICAL IMPRESSION(S) / ED DIAGNOSES    Final diagnoses:  Diabetic ketoacidosis without coma associated with type 2 diabetes mellitus [facility])     ED Discharge Orders    None      Portions of this note were generated with dragon dictation software. Dictation errors may occur despite best attempts at proofreading.   Sharman Cheek, MD 04/17/20 [phone]

## 2020-04-17 NOTE — H&P (Signed)
History and Physical   Clinton Cole WUJ:[id number] DOB: 1965-05-05 DOA: 04/17/2020  [facility]   Patient coming from: PCP office  Chief Complaint: Hyperglycemia, thirst and dry mouth  HPI: Clinton Cole is a [age] y.o. male with medical history significant of inguinal hernia status post repair who presents from his doctor's office due to significant hyperglycemia.  As above patient was seen at his doctor's office today and had hyperglycemia and was instructed to present to the ED.  He is also had about a month of increased thirst, dry mouth, weakness.  He also reports increased urinary frequency, abdominal pain and some constipation (with one bowel movement about every 3 days), and decreased appetite. (He does have a history of hernia status post repair in the region where his pain is reported.) He denies fever, chills, chest pain, shortness of breath, nausea, vomiting.  ED Course: Vital signs in ED significant for initial tachycardia in the 110s which is improved to the 90s with fluid boluses, blood pressure in the 120s to 140s systolic.  Lab work-up showed BMP with sodium 130 which corrects to 143 considering glucose of 942, potassium 5.1, chloride 91, bicarb 19, gap 20, creatinine 1.16 up from baseline of 0.6.  CBC within normal limits.  Urinalysis showing greater than 500 glucose and 20 ketones.  Respiratory panel flu COVID pending.  CT abdomen pelvis was ordered and is pending.  Patient started on insulin drip per DKA protocol and received 2 L IV fluids in ED.  Review of Systems: As per HPI otherwise all other systems reviewed and are negative.  Past Medical History:  Diagnosis Date  . Hernia, inguinal     Past Surgical History:  Procedure Laterality Date  . HERNIA REPAIR Left 2001  . INGUINAL HERNIA REPAIR Left 10/30/2018   Procedure: LAPAROSCOPIC INGUINAL HERNIA;  Surgeon: Carolan Shiverintron-Diaz, Edgardo, MD;  Location: [facility] ORS;  Service: General;   Laterality: Left;    Social History  reports that he has been smoking cigarettes. He has smoked for the past 20.00 years. He has never used smokeless tobacco. He reports that he does not drink alcohol and does not use drugs.  No Known Allergies  Family History  Problem Relation Age of Onset  . Diabetes Sibling   . Diabetes Sibling   . Diabetes Sibling    Reviewed on admission  Prior to Admission medications   Medication Sig Start Date End Date Taking? Authorizing Provider  acetaminophen (TYLENOL) 325 MG tablet Take 975 mg by mouth every 6 (six) hours as needed for moderate pain.    [provider]  ibuprofen (ADVIL,MOTRIN) 600 MG tablet Take 1 tablet (600 mg total) by mouth every 8 (eight) hours as needed. 01/30/18   Tommi RumpsSummers, Rhonda L, PA-C  naproxen (NAPROSYN) 500 MG tablet Take 1 tablet (500 mg total) by mouth 2 (two) times daily with a meal. 07/10/19   Tommi RumpsSummers, Rhonda L, PA-C  traMADol (ULTRAM) 50 MG tablet Take 1 tablet (50 mg total) by mouth every 6 (six) hours as needed. 07/10/19   Tommi RumpsSummers, Rhonda L, PA-C    Physical Exam: Vitals:   04/17/20 1642 04/17/20 1644  BP: (!) 124/92   Pulse: (!) 113   Resp: 16   Temp: 98.4 F (36.9 C)   TempSrc: Oral   SpO2: 100%   Weight:  73.5 kg  Height:  5' 7.72" (1.72 m)   Physical Exam Constitutional:      General: He is not in acute  distress.    Appearance: Normal appearance.  HENT:     Head: Normocephalic and atraumatic.     Mouth/Throat:     Mouth: Mucous membranes are dry.     Pharynx: Oropharynx is clear.  Eyes:     Extraocular Movements: Extraocular movements intact.     Pupils: Pupils are equal, round, and reactive to light.  Cardiovascular:     Rate and Rhythm: Normal rate and regular rhythm.     Pulses: Normal pulses.     Heart sounds: Normal heart sounds.  Pulmonary:     Effort: Pulmonary effort is normal. No respiratory distress.     Breath sounds: Normal breath sounds.  Abdominal:     General: Bowel  sounds are normal. There is no distension.     Palpations: Abdomen is soft.     Tenderness: There is no abdominal tenderness.  Musculoskeletal:        General: No swelling or deformity.  Skin:    General: Skin is warm and dry.  Neurological:     General: No focal deficit present.     Mental Status: Mental status is at baseline.    Labs on Admission: I have personally reviewed following labs and imaging studies  CBC: Recent Labs  Lab 04/17/20 1656  WBC 6.8  HGB 16.2  HCT 50.0  MCV 92.6  PLT 205    Basic Metabolic Panel: Recent Labs  Lab 04/17/20 1656  NA 130*  K 5.1  CL 91*  CO2 19*  GLUCOSE 942*  BUN 18  CREATININE 1.16  CALCIUM 9.5    GFR: Estimated Creatinine Clearance: 68.9 mL/min (by C-G formula based on SCr of 1.16 mg/dL).  Liver Function Tests: No results for input(s): AST, ALT, ALKPHOS, BILITOT, PROT, ALBUMIN in the last 168 hours.  Urine analysis:    Component Value Date/Time   COLORURINE STRAW (A) 04/17/2020 1656   APPEARANCEUR CLEAR (A) 04/17/2020 1656   LABSPEC 1.029 04/17/2020 1656   PHURINE 5.0 04/17/2020 1656   GLUCOSEU >=500 (A) 04/17/2020 1656   HGBUR NEGATIVE 04/17/2020 1656   BILIRUBINUR NEGATIVE 04/17/2020 1656   KETONESUR 20 (A) 04/17/2020 1656   PROTEINUR NEGATIVE 04/17/2020 1656   NITRITE NEGATIVE 04/17/2020 1656   LEUKOCYTESUR NEGATIVE 04/17/2020 1656    Radiological Exams on Admission: CT ABDOMEN PELVIS W CONTRAST  Result Date: 04/17/2020 CLINICAL DATA:  Bowel obstruction suspected. Hyperglycemia. Increased thirst. EXAM: CT ABDOMEN AND PELVIS WITH CONTRAST TECHNIQUE: Multidetector CT imaging of the abdomen and pelvis was performed using the standard protocol following bolus administration of intravenous contrast. CONTRAST:  OMNIPAQUE IOHEXOL 300 MG/ML  SOLN COMPARISON:  CT abdomen pelvis 10/13/2018 FINDINGS: Lower chest: No acute abnormality. Hepatobiliary: The hepatic parenchyma is diffusely hypodense compared to the  splenic parenchyma consistent with fatty infiltration. Likely variant perfusion of the liver (2:16). Otherwise no focal liver abnormality. No gallstones, gallbladder wall thickening, or pericholecystic fluid. No biliary dilatation. Pancreas: No focal lesion. Normal pancreatic contour. No surrounding inflammatory changes. No main pancreatic ductal dilatation. Spleen: Normal in size without focal abnormality. Adrenals/Urinary Tract: No adrenal nodule bilaterally. Bilateral kidneys enhance symmetrically. No hydronephrosis. No hydroureter. The urinary bladder is unremarkable. On delayed imaging, there is no urothelial wall thickening and there are no filling defects in the opacified portions of the bilateral collecting systems or ureters. Stomach/Bowel: Stomach is within normal limits. No evidence of bowel wall thickening or dilatation. Appendix appears normal in caliber with no signs of inflammatory changes. An appendicoliths is noted at its  base. Vascular/Lymphatic: No abdominal aorta or iliac aneurysm. Severe calcified and noncalcified atherosclerotic plaque of the aorta and its branches. No abdominal, pelvic, or inguinal lymphadenopathy. Reproductive: Prostate is unremarkable. Other: No intraperitoneal free fluid. No intraperitoneal free gas. No organized fluid collection. Musculoskeletal: Surgical changes related to left inguinal hernia repair. No findings to suggest recurrent hernia. Tiny fat containing left inguinal hernia. No ventral hernia identified. No suspicious lytic or blastic osseous lesions. No acute displaced fracture. Multilevel degenerative changes of the spine. 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 IMPRESSION: 1. No bowel obstruction. No acute intra-abdominal intrapelvic abnormality. 2. Status post left inguinal hernia repair with no associated recurrence. 3. Stable tiny fat containing right inguinal hernia. 4. Hepatic steatosis. 5.  Aortic Atherosclerosis (ICD10-I70.0).  Electronically Signed   By: Tish Frederickson M.D.   On: 04/17/2020 18:29   EKG: Not yet obtained  Assessment/Plan Principal Problem:   DKA (diabetic ketoacidosis) (HCC) Active Problems:   AKI (acute kidney injury) (HCC)  DKA New onset diabetes > Glucose 942, potassium 5.1, bicarb 19, gap 20, ketones in the urine. > No clear trigger at this time does have constipation with history of hernia repair CT abdomen pelvis ordered in ED but not yet back > Suspect strong genetic component as patient is normal weight and reports he has 3 siblings who also have developed diabetes later in life who are also not overweight. > CT results returned with no SBO no acute intra-abdominal process, stable changes of left hernia repair. - Admit to progressive unit - Continue insulin drip - No potassium supplementation at this time as initial K 5.1  - LR at 125 mL/hr until CBG less than 250 - Switch to D5-LR when 1 CBG less than 250 - Nothing by mouth  - BMET every 4 hours - Trend beta hydroxybutyric acid - CBG Q1H - Once anion gap closed 2, start CM diet and if able to eat, administer Lantus 10 units - Continue insulin drip for 1-2 more hours, then discontinue and start SSI-S  - DC fluids if eating, drinking, and off insulin drip  AKI > Creatinine elevated to 1.16 from baseline of 0.6 representing greater than 0.3 increase. > Occurs in the setting of DKA leading to increased urinary output and dehydration. - Avoid nephrotoxic agents - Continue IV fluids as above - Trend renal function and electrolytes as above  DVT prophylaxis: Lovenox  Code Status:   Full  Family Communication:  None on admission Disposition Plan:   Patient is from:  Home  Anticipated DC to:  Home  Anticipated DC date:  1 to 2 days  Anticipated DC barriers: None  Consults called:  None  Admission status:  Observation, progressive   Severity of Illness: The appropriate patient status for this patient is OBSERVATION.  Observation status is judged to be reasonable and necessary in order to provide the required intensity of service to ensure the patient's safety. The patient's presenting symptoms, physical exam findings, and initial radiographic and laboratory data in the context of their medical condition is felt to place them at decreased risk for further clinical deterioration. Furthermore, it is anticipated that the patient will be medically stable for discharge from the hospital within 2 midnights of admission. The following factors support the patient status of observation.   " The patient's presenting symptoms include hyperglycemia, thirst, dry mouth, weakness, abdominal pain, constipation. " The physical exam findings include dry mucous membranes " The initial radiographic and laboratory data are significant for  glucose 942, creatinine 1.16 from baseline 0.6, bicarb 19, gap of 20, ketones in urine.   Synetta Fail MD [facility]  How to contact the [facility] Attending or Consulting provider 7A - 7P or covering provider during after hours 7P -7A, for this patient?   1. Check the care team in [facility] and look for a) attending/consulting [facility] provider listed and b) the [facility] team listed 2. Log into [email or web address] and use [place]'s universal password to access. If you do not have the password, please contact the hospital operator. 3. Locate the [facility] provider you are looking for under [facility] and page to a number that you can be directly reached. 4. If you still have difficulty reaching the provider, please page the [facility] (Director on Call) for the Hospitalists listed on amion for assistance.  04/17/2020, 7:33 PM

## 2020-04-17 NOTE — ED Notes (Signed)
Critical value: 942  Test: glucose, bld Received at 17/17 at 1721 Reported to: Sharman Cheek, MD

## 2020-04-18 LAB — HIV ANTIBODY (ROUTINE TESTING W REFLEX): HIV Screen 4th Generation wRfx: NONREACTIVE

## 2020-04-18 LAB — BASIC METABOLIC PANEL
Anion gap: 7 (ref 5–15)
Anion gap: 7 (ref 5–15)
Anion gap: 8 (ref 5–15)
Anion gap: 10 (ref 5–15)
BUN: 9 mg/dL (ref 6–20)
BUN: 7 mg/dL (ref 6–20)
BUN: 7 mg/dL (ref 6–20)
BUN: 9 mg/dL (ref 6–20)
CO2: 25 mmol/L (ref 22–32)
CO2: 27 mmol/L (ref 22–32)
CO2: 26 mmol/L (ref 22–32)
CO2: 26 mmol/L (ref 22–32)
Calcium: 8.2 mg/dL — ABNORMAL LOW (ref 8.9–10.3)
Calcium: 8.3 mg/dL — ABNORMAL LOW (ref 8.9–10.3)
Calcium: 8.2 mg/dL — ABNORMAL LOW (ref 8.9–10.3)
Calcium: 8.6 mg/dL — ABNORMAL LOW (ref 8.9–10.3)
Chloride: 111 mmol/L (ref 98–111)
Chloride: 108 mmol/L (ref 98–111)
Chloride: 105 mmol/L (ref 98–111)
Chloride: 99 mmol/L (ref 98–111)
Creatinine, Ser: 0.57 mg/dL — ABNORMAL LOW (ref 0.61–1.24)
Creatinine, Ser: 0.58 mg/dL — ABNORMAL LOW (ref 0.61–1.24)
Creatinine, Ser: 0.63 mg/dL (ref 0.61–1.24)
Creatinine, Ser: 0.69 mg/dL (ref 0.61–1.24)
GFR, Estimated: >60 mL/min (ref >60)
GFR, Estimated: >60 mL/min (ref >60)
GFR, Estimated: >60 mL/min (ref >60)
GFR, Estimated: >60 mL/min (ref >60)
Glucose, Bld: 179 mg/dL — ABNORMAL HIGH (ref 70–99)
Glucose, Bld: 189 mg/dL — ABNORMAL HIGH (ref 70–99)
Glucose, Bld: 271 mg/dL — ABNORMAL HIGH (ref 70–99)
Glucose, Bld: 262 mg/dL — ABNORMAL HIGH (ref 70–99)
Potassium: 2.7 mmol/L — CL (ref 3.5–5.1)
Potassium: 2.7 mmol/L — CL (ref 3.5–5.1)
Potassium: 3.3 mmol/L — ABNORMAL LOW (ref 3.5–5.1)
Potassium: 3.6 mmol/L (ref 3.5–5.1)
Sodium: 143 mmol/L (ref 135–145)
Sodium: 142 mmol/L (ref 135–145)
Sodium: 139 mmol/L (ref 135–145)
Sodium: 135 mmol/L (ref 135–145)

## 2020-04-18 LAB — GLUCOSE, CAPILLARY
Glucose-Capillary: 150 mg/dL — ABNORMAL HIGH (ref 70–99)
Glucose-Capillary: 170 mg/dL — ABNORMAL HIGH (ref 70–99)
Glucose-Capillary: 183 mg/dL — ABNORMAL HIGH (ref 70–99)
Glucose-Capillary: 190 mg/dL — ABNORMAL HIGH (ref 70–99)
Glucose-Capillary: 193 mg/dL — ABNORMAL HIGH (ref 70–99)
Glucose-Capillary: 240 mg/dL — ABNORMAL HIGH (ref 70–99)
Glucose-Capillary: 327 mg/dL — ABNORMAL HIGH (ref 70–99)
Glucose-Capillary: 97 mg/dL (ref 70–99)

## 2020-04-18 LAB — HEMOGLOBIN A1C
Hgb A1c MFr Bld: 14.7 % — ABNORMAL HIGH (ref 4.8–5.6)
Mean Plasma Glucose: 375.19 mg/dL

## 2020-04-18 LAB — MAGNESIUM: Magnesium: 1.8 mg/dL (ref 1.7–2.4)

## 2020-04-18 LAB — BETA-HYDROXYBUTYRIC ACID
Beta-Hydroxybutyric Acid: 2 mmol/L — ABNORMAL HIGH (ref 0.05–0.27)
Beta-Hydroxybutyric Acid: 3.04 mmol/L — ABNORMAL HIGH (ref 0.05–0.27)

## 2020-04-18 LAB — PHOSPHORUS: Phosphorus: 2.6 mg/dL (ref 2.5–4.6)

## 2020-04-18 MED ORDER — INSULIN GLARGINE 100 UNIT/ML SOLOSTAR PEN: 10.0000 [IU] | PEN_INJECTOR | Freq: Every day | SUBCUTANEOUS | 11 refills | Status: AC

## 2020-04-18 MED ORDER — PEN NEEDLES 32G X 4 MM MISC: 10.0000 [IU] | Freq: Every day | 1 refills | Status: AC

## 2020-04-18 MED ORDER — INSULIN STARTER KIT- SYRINGES (SPANISH)
1.0000 | Freq: Once | Status: DC
  Filled 2020-04-18: qty 1

## 2020-04-18 MED ORDER — BLOOD GLUCOSE MONITOR KIT: PACK | 0 refills | Status: AC

## 2020-04-18 MED ORDER — INSULIN GLARGINE 100 UNIT/ML ~~LOC~~ SOLN
10.0000 [IU] | Freq: Every day | SUBCUTANEOUS | Status: DC
  Filled 2020-04-18: qty 0.1

## 2020-04-18 MED ORDER — METFORMIN HCL ER 500 MG PO TB24: 1000.0000 mg | ORAL_TABLET | Freq: Two times a day (BID) | ORAL | 1 refills | Status: AC

## 2020-04-18 MED ORDER — POTASSIUM CHLORIDE CRYS ER 20 MEQ PO TBCR
40.0000 meq | EXTENDED_RELEASE_TABLET | Freq: Once | ORAL | Status: AC
  Administered 2020-04-18: 40 meq via ORAL
  Filled 2020-04-18: qty 2

## 2020-04-18 MED ORDER — LACTATED RINGERS IV SOLN: INTRAVENOUS | Status: DC

## 2020-04-18 MED ORDER — POTASSIUM CHLORIDE 10 MEQ/100ML IV SOLN
10.0000 meq | INTRAVENOUS | Status: AC
  Administered 2020-04-18 (×4): 10 meq via INTRAVENOUS
  Filled 2020-04-18 (×9): qty 100

## 2020-04-18 MED ORDER — INSULIN ASPART 100 UNIT/ML ~~LOC~~ SOLN
0.0000 [IU] | Freq: Three times a day (TID) | SUBCUTANEOUS | Status: DC
  Administered 2020-04-18: 11 [IU] via SUBCUTANEOUS
  Filled 2020-04-18: qty 1

## 2020-04-18 MED ORDER — INSULIN GLARGINE 100 UNIT/ML ~~LOC~~ SOLN
10.0000 [IU] | Freq: Every day | SUBCUTANEOUS | Status: DC
  Administered 2020-04-18: 10 [IU] via SUBCUTANEOUS
  Filled 2020-04-18 (×2): qty 0.1

## 2020-04-18 MED ORDER — LIVING WELL WITH DIABETES BOOK - IN SPANISH
Freq: Once | Status: AC
  Filled 2020-04-18: qty 1

## 2020-04-18 MED ORDER — LISINOPRIL 5 MG PO TABS: 5.0000 mg | ORAL_TABLET | Freq: Every day | ORAL | 0 refills | Status: AC

## 2020-04-18 NOTE — Discharge Summary (Signed)
Physician Discharge Summary  Clinton Cole [id number] DOB: 26-Dec-1965 DOA: 04/17/2020  PCP: [facility] [place] date: 04/17/2020 Discharge date: 04/18/2020  Admitted From: Home Disposition: Home  Recommendations for Outpatient Follow-up:  1. Follow up with PCP in 1-2 weeks 2. Please obtain BMP/CBC in one week 3. Please follow up on the following pending results: None  Home Health: No Equipment/Devices: None Discharge Condition: Stable CODE STATUS: Full Diet recommendation: Heart Healthy / Carb Modified  Brief/Interim Summary: Clinton Cole is a 55 y.o. male with medical history significant of inguinal hernia status post repair who presents from his doctor's office due to significant hyperglycemia.  He was seen at his doctor's office with increase thirst, dry mouth and weakness along with increased urinary frequency and abdominal pain.  No prior diagnosis of diabetes. He was found to be hyperglycemic and in diabetic ketoacidosis.  COVID-19 was negative.  Elevated beta beta hydroxybutyric acid which was normalized before discharge.  A1c of 14.7.  He was treated initially with insulin infusion and later transition to basal and long-acting. He was discharged on Metformin and Lantus and will follow up with his primary care provider for further recommendations and titration of his medications.  Blood pressure was mildly elevated and he was started on low-dose lisinopril.  Primary care provider should be able to uptitrate as needed.  Patient had AKI on presentation which was resolved with IV fluid.- Hypokalemia was resolved with repletion of potassium before discharge.  CT abdomen was obtained because of his complaint of abdominal pain which was negative for any acute abnormality and consistent with changes secondary to recent hernia repair.  Discharge Diagnoses:  Principal Problem:   DKA (diabetic ketoacidosis) ([place]) Active Problems:   AKI (acute  kidney injury) [facility])  Discharge Instructions  Discharge Instructions    Diet - low sodium heart healthy   Complete by: As directed    Discharge instructions   Complete by: As directed    It was pleasure taking care of you. I am giving you printed prescriptions for glucometer, Metformin for your diabetes and lisinopril for your blood pressure, these medications are normally cheaper at [facility], you can take them to the pharmacy of your choice. Keep checking your blood glucose level regularly and follow-up with your doctor within a week, take your glucometer with you so they can see your blood glucose levels. Follow the instructions from our dietitian about your diet. Keep yourself well-hydrated.   Increase activity slowly   Complete by: As directed      Allergies as of 04/18/2020   No Known Allergies     Medication List    STOP taking these medications   traMADol 50 MG tablet Commonly known as: Ultram     TAKE these medications   acetaminophen 325 MG tablet Commonly known as: TYLENOL Take 975 mg by mouth every 6 (six) hours as needed for moderate pain.   blood glucose meter kit and supplies Kit Dispense based on patient and insurance preference. Use up to four times daily as directed.   ibuprofen 600 MG tablet Commonly known as: ADVIL Take 1 tablet (600 mg total) by mouth every 8 (eight) hours as needed.   insulin glargine 100 UNIT/ML Solostar Pen Commonly known as: LANTUS Inject 10 Units into the skin daily.   lisinopril 5 MG tablet Commonly known as: ZESTRIL Take 1 tablet (5 mg total) by mouth daily.   metFORMIN 500 MG 24 hr tablet Commonly known as: GLUCOPHAGE-XR Take  2 tablets (1,000 mg total) by mouth in the morning and at bedtime.   naproxen 500 MG tablet Commonly known as: Naprosyn Take 1 tablet (500 mg total) by mouth 2 (two) times daily with a meal.   Pen Needles 32G X 4 MM Misc 10 Units by Does not apply route daily.       Follow-up [place] Schedule an appointment as soon as possible for a visit in 1 week(s).   Specialty: General Practice Contact information: [place] [address] [phone]              No Known Allergies  Consultations:  None  Procedures/Studies: CT ABDOMEN PELVIS W CONTRAST  Result Date: 04/17/2020 CLINICAL DATA:  Bowel obstruction suspected. Hyperglycemia. Increased thirst. EXAM: CT ABDOMEN AND PELVIS WITH CONTRAST TECHNIQUE: Multidetector CT imaging of the abdomen and pelvis was performed using the standard protocol following bolus administration of intravenous contrast. CONTRAST:  188m OMNIPAQUE IOHEXOL 300 MG/ML  SOLN COMPARISON:  CT abdomen pelvis 10/13/2018 FINDINGS: Lower chest: No acute abnormality. Hepatobiliary: The hepatic parenchyma is diffusely hypodense compared to the splenic parenchyma consistent with fatty infiltration. Likely variant perfusion of the liver (2:16). Otherwise no focal liver abnormality. No gallstones, gallbladder wall thickening, or pericholecystic fluid. No biliary dilatation. Pancreas: No focal lesion. Normal pancreatic contour. No surrounding inflammatory changes. No main pancreatic ductal dilatation. Spleen: Normal in size without focal abnormality. Adrenals/Urinary Tract: No adrenal nodule bilaterally. Bilateral kidneys enhance symmetrically. No hydronephrosis. No hydroureter. The urinary bladder is unremarkable. On delayed imaging, there is no urothelial wall thickening and there are no filling defects in the opacified portions of the bilateral collecting systems or ureters. Stomach/Bowel: Stomach is within normal limits. No evidence of bowel wall thickening or dilatation. Appendix appears normal in caliber with no signs of inflammatory changes. An appendicoliths is noted at its base. Vascular/Lymphatic: No abdominal aorta or iliac aneurysm. Severe calcified and noncalcified atherosclerotic plaque of  the aorta and its branches. No abdominal, pelvic, or inguinal lymphadenopathy. Reproductive: Prostate is unremarkable. Other: No intraperitoneal free fluid. No intraperitoneal free gas. No organized fluid collection. Musculoskeletal: Surgical changes related to left inguinal hernia repair. No findings to suggest recurrent hernia. Tiny fat containing left inguinal hernia. No ventral hernia identified. No suspicious lytic or blastic osseous lesions. No acute displaced fracture. Multilevel degenerative changes of the spine. 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 IMPRESSION: 1. No bowel obstruction. No acute intra-abdominal intrapelvic abnormality. 2. Status post left inguinal hernia repair with no associated recurrence. 3. Stable tiny fat containing right inguinal hernia. 4. Hepatic steatosis. 5.  Aortic Atherosclerosis (ICD10-I70.0). Electronically Signed   By: MIven FinnM.D.   On: 04/17/2020 18:29     Subjective: Patient was feeling better when seen today.  Mild left lower quadrant pain, no tenderness.  CT abdomen was without any acute abnormality. Discussed about him being diabetic and need to follow-up closely with his primary care provider for better control.  Discharge Exam: Vitals:   04/18/20 1000 04/18/20 1100  BP: 97/85 140/83  Pulse: 73 70  Resp:    Temp:    SpO2: 97% 98%   Vitals:   04/18/20 0800 04/18/20 0900 04/18/20 1000 04/18/20 1100  BP: 120/79 123/71 97/85 140/83  Pulse: 88 79 73 70  Resp:      Temp:      TempSrc:      SpO2: 100% 97% 97% 98%  Weight:  Height:        General: Pt is alert, awake, not in acute distress Cardiovascular: RRR, S1/S2 +, no rubs, no gallops Respiratory: CTA bilaterally, no wheezing, no rhonchi Abdominal: Soft, NT, ND, bowel sounds + Extremities: no edema, no cyanosis   The results of significant diagnostics from this hospitalization (including imaging, microbiology, ancillary and laboratory) are listed below for  reference.    Microbiology: Recent Results (from the past 240 hour(s))  Resp Panel by RT-PCR (Flu A&B, Covid) Nasopharyngeal Swab     Status: None   Collection Time: 04/17/20  6:30 PM   Specimen: Nasopharyngeal Swab; Nasopharyngeal(NP) swabs in vial transport medium  Result Value Ref Range Status   SARS Coronavirus 2 by RT PCR NEGATIVE NEGATIVE Final    Comment: (NOTE) SARS-CoV-2 target nucleic acids are NOT DETECTED.  The SARS-CoV-2 RNA is generally detectable in upper respiratory specimens during the acute phase of infection. The lowest concentration of SARS-CoV-2 viral copies this assay can detect is 138 copies/mL. A negative result does not preclude SARS-Cov-2 infection and should not be used as the sole basis for treatment or other patient management decisions. A negative result may occur with  improper specimen collection/handling, submission of specimen other than nasopharyngeal swab, presence of viral mutation(s) within the areas targeted by this assay, and inadequate number of viral copies(<138 copies/mL). A negative result must be combined with clinical observations, patient history, and epidemiological information. The expected result is Negative.  Fact Sheet for Patients:  EntrepreneurPulse.com.au  Fact Sheet for Healthcare Providers:  IncredibleEmployment.be  This test is no t yet approved or cleared by the [place] FDA and  has been authorized for detection and/or diagnosis of SARS-CoV-2 by FDA under an Emergency Use Authorization (EUA). This EUA will remain  in effect (meaning this test can be used) for the duration of the COVID-19 declaration under Section 564(b)(1) of the Act, 21 U.S.C.section 360bbb-3(b)(1), unless the authorization is terminated  or revoked sooner.       Influenza A by PCR NEGATIVE NEGATIVE Final   Influenza B by PCR NEGATIVE NEGATIVE Final    Comment: (NOTE) The Xpert Xpress SARS-CoV-2/FLU/RSV  plus assay is intended as an aid in the diagnosis of influenza from Nasopharyngeal swab specimens and should not be used as a sole basis for treatment. Nasal washings and aspirates are unacceptable for Xpert Xpress SARS-CoV-2/FLU/RSV testing.  Fact Sheet for Patients: EntrepreneurPulse.com.au  Fact Sheet for Healthcare Providers: IncredibleEmployment.be  This test is not yet approved or cleared by the [place] FDA and has been authorized for detection and/or diagnosis of SARS-CoV-2 by FDA under an Emergency Use Authorization (EUA). This EUA will remain in effect (meaning this test can be used) for the duration of the COVID-19 declaration under Section 564(b)(1) of the Act, 21 U.S.C. section 360bbb-3(b)(1), unless the authorization is terminated or revoked.  Performed at [facility], [address] [place], [place] [address]      Labs: BNP (last 3 results) No results for input(s): BNP in the last 8760 hours. Basic Metabolic Panel: Recent Labs  Lab 04/17/20 1656 04/17/20 2100 04/18/20 0217 04/18/20 0524 04/18/20 0924  NA 130* 139 143 142 139  K 5.1 4.1 2.7* 2.7* 3.3*  CL 91* 106 111 108 105  CO2 19* 18* 25 27 26   GLUCOSE 942* 339* 179* 189* 271*  BUN 18 14 9 7 7   CREATININE 1.16 0.94 0.57* 0.58* 0.63  CALCIUM 9.5 9.2 8.2* 8.3* 8.2*  MG  --   --  1.8  --   --   PHOS  --   --  2.6  --   --    Liver Function Tests: No results for input(s): AST, ALT, ALKPHOS, BILITOT, PROT, ALBUMIN in the last 168 hours. No results for input(s): LIPASE, AMYLASE in the last 168 hours. No results for input(s): AMMONIA in the last 168 hours. CBC: Recent Labs  Lab 04/17/20 1656  WBC 6.8  HGB 16.2  HCT 50.0  MCV 92.6  PLT 205   Cardiac Enzymes: No results for input(s): CKTOTAL, CKMB, CKMBINDEX, TROPONINI in the last 168 hours. BNP: Invalid input(s): POCBNP CBG: Recent Labs  Lab 04/18/20 0357 04/18/20 0516 04/18/20 0620  04/18/20 0713 04/18/20 1115  GLUCAP 170* 190* 240* 97 327*   D-Dimer No results for input(s): DDIMER in the last 72 hours. Hgb A1c Recent Labs    04/17/20 1923  HGBA1C 14.7*   Lipid Profile No results for input(s): CHOL, HDL, LDLCALC, TRIG, CHOLHDL, LDLDIRECT in the last 72 hours. Thyroid function studies No results for input(s): TSH, T4TOTAL, T3FREE, THYROIDAB in the last 72 hours.  Invalid input(s): FREET3 Anemia work up No results for input(s): VITAMINB12, FOLATE, FERRITIN, TIBC, IRON, RETICCTPCT in the last 72 hours. Urinalysis    Component Value Date/Time   COLORURINE STRAW (A) 04/17/2020 1656   APPEARANCEUR CLEAR (A) 04/17/2020 1656   LABSPEC 1.029 04/17/2020 1656   PHURINE 5.0 04/17/2020 1656   GLUCOSEU >=500 (A) 04/17/2020 1656   HGBUR NEGATIVE 04/17/2020 1656   BILIRUBINUR NEGATIVE 04/17/2020 1656   KETONESUR 20 (A) 04/17/2020 1656   PROTEINUR NEGATIVE 04/17/2020 1656   NITRITE NEGATIVE 04/17/2020 1656   LEUKOCYTESUR NEGATIVE 04/17/2020 1656   Sepsis Labs Invalid input(s): PROCALCITONIN,  WBC,  LACTICIDVEN Microbiology Recent Results (from the past 240 hour(s))  Resp Panel by RT-PCR (Flu A&B, Covid) Nasopharyngeal Swab     Status: None   Collection Time: 04/17/20  6:30 PM   Specimen: Nasopharyngeal Swab; Nasopharyngeal(NP) swabs in vial transport medium  Result Value Ref Range Status   SARS Coronavirus 2 by RT PCR NEGATIVE NEGATIVE Final    Comment: (NOTE) SARS-CoV-2 target nucleic acids are NOT DETECTED.  The SARS-CoV-2 RNA is generally detectable in upper respiratory specimens during the acute phase of infection. The lowest concentration of SARS-CoV-2 viral copies this assay can detect is 138 copies/mL. A negative result does not preclude SARS-Cov-2 infection and should not be used as the sole basis for treatment or other patient management decisions. A negative result may occur with  improper specimen collection/handling, submission of specimen  other than nasopharyngeal swab, presence of viral mutation(s) within the areas targeted by this assay, and inadequate number of viral copies(<138 copies/mL). A negative result must be combined with clinical observations, patient history, and epidemiological information. The expected result is Negative.  Fact Sheet for Patients:  EntrepreneurPulse.com.au  Fact Sheet for Healthcare Providers:  IncredibleEmployment.be  This test is no t yet approved or cleared by the [place] FDA and  has been authorized for detection and/or diagnosis of SARS-CoV-2 by FDA under an Emergency Use Authorization (EUA). This EUA will remain  in effect (meaning this test can be used) for the duration of the COVID-19 declaration under Section 564(b)(1) of the Act, 21 U.S.C.section 360bbb-3(b)(1), unless the authorization is terminated  or revoked sooner.       Influenza A by PCR NEGATIVE NEGATIVE Final   Influenza B by PCR NEGATIVE NEGATIVE Final    Comment: (NOTE) The Xpert Xpress SARS-CoV-2/FLU/RSV plus  assay is intended as an aid in the diagnosis of influenza from Nasopharyngeal swab specimens and should not be used as a sole basis for treatment. Nasal washings and aspirates are unacceptable for Xpert Xpress SARS-CoV-2/FLU/RSV testing.  Fact Sheet for Patients: EntrepreneurPulse.com.au  Fact Sheet for Healthcare Providers: IncredibleEmployment.be  This test is not yet approved or cleared by the [place] FDA and has been authorized for detection and/or diagnosis of SARS-CoV-2 by FDA under an Emergency Use Authorization (EUA). This EUA will remain in effect (meaning this test can be used) for the duration of the COVID-19 declaration under Section 564(b)(1) of the Act, 21 U.S.C. section 360bbb-3(b)(1), unless the authorization is terminated or revoked.  Performed at [facility], [address]  [place], [place] [address]     Time coordinating discharge: Over 30 minutes  SIGNED:  Lorella Nimrod, MD  [facility] 04/18/2020, 2:50 PM  If 7PM-7AM, please contact night-coverage [email or web address]  This record has been created using Systems analyst. Errors have been sought and corrected,but may not always be located. Such creation errors do not reflect on the standard of care.

## 2020-04-18 NOTE — Progress Notes (Addendum)
Inpatient Diabetes Program Recommendations  AACE/ADA: New Consensus Statement on Inpatient Glycemic Control   Target Ranges:  Prepandial:   less than 140 mg/dL      Peak postprandial:   less than 180 mg/dL (1-2 hours)      Critically ill patients:  140 - 180 mg/dL  Results for Clinton Cole, Clinton Cole (MRN [id number]) as of 04/18/2020 11:31  Ref. Range 04/18/2020 11:15  Glucose-Capillary Latest Ref Range: 70 - 99 mg/dL 327 (H)   Results for Clinton Cole, Clinton Cole (MRN [id number]) as of 04/18/2020 07:46  Ref. Range 04/18/2020 00:57 04/18/2020 01:59 04/18/2020 02:59 04/18/2020 03:57 04/18/2020 05:16 04/18/2020 06:20 04/18/2020 07:13  Glucose-Capillary Latest Ref Range: 70 - 99 mg/dL 193 (H) 183 (H) 150 (H) 170 (H) 190 (H) 240 (H) 97   Results for Clinton Cole, Clinton Cole (MRN [id number]) as of 04/18/2020 07:46  Ref. Range 04/17/2020 16:56  CO2 Latest Ref Range: 22 - 32 mmol/L 19 (L)  Glucose Latest Ref Range: 70 - 99 mg/dL 942 (HH)  Anion gap Latest Ref Range: 5 - 15  20 (H)   Review of Glycemic Control  Diabetes history: No Outpatient Diabetes medications: NA Current orders for Inpatient glycemic control: Lantus 10 units daily, Novolog 0-15 units TID with meals and bedtime  Inpatient Diabetes Program Recommendations:    HbgA1C: A1C in process.  NOTE: Noted consult for Diabetes Coordinator. Diabetes Coordinator is not on campus over the weekend but available by pager from 8am to 5pm for questions or concerns. Per H&P, "patient seen at his doctor's office today and had hyperglycemia and was instructed to present to the ED.  He is also had about a month of increased thirst, dry mouth, weakness.  He also reports increased urinary frequency, abdominal pain and some constipation (with one bowel movement about every 3 days), and decreased appetite."  Also noted in chart that patient has family hx of DM. Per chart, patient does not have any insurance and PCP is listed as [facility].   Initial glucose 942  mg/dl on 04/17/20 and patient was ordered IV insulin which has been transitioned to SQ insulin. Patient received Lantus 10 units at 5:48 am today. Current A1C in process. Will follow glucose trends over the next 24 hours to help determine insulin needs. Ordered Living well with DM (Spanish), TOC consult (medication assistance and to provide glucometer kit if available), RD consult for diet education, and patient education by bedside RNs. Will ask nursing to allow patient to self inject insulin so he can become comfortable with self injecting insulin (in case prescribed at discharge). Since patient has no insurance, will need to prescribe affordable DM medications. Will plan to speak with patient over the phone today (using interpreter services).  Addendum 04/18/20@10 :40-Spoke with patient over the phone (with interpreter services [phone]) about new diabetes diagnosis.  Patient confirms that he does not have any insurance and he goes to [facility]. Patient reports that he has no prior DM hx. Patient states that the doctor at the clinic had him come to the hospital for high glucose. Discussed initial glucose of 942 mg/dl and how insulin was used to get glucose down.  Discussed basic pathophysiology of DM Type 2, basic home care, importance of checking CBGs and maintaining good CBG control to prevent long-term and short-term complications. Current A1C still in process. Explained what an A1C is and encouraged patient to access online chart so he can find out results if not resulted prior to discharge.  Reviewed glucose  and A1C goals. Reviewed signs and symptoms of hyperglycemia and hypoglycemia along with treatment for both. Discussed impact of nutrition, exercise, stress, sickness, and medications on diabetes control. Informed patient he will be receiving Living Well with diabetes booklet (Spanish) and encouraged patient to read through entire book.  Asked patient to check his glucose as MD directs. Explained  how the doctor he follows up with can use the glucose values to continue to make adjustments if needed. Patient states that the doctor at the hospital has told him he will be discharged on oral DM medications. Discussed that he would need affordable DM medications since he does not have insurance and that [facility] has been consulted for assistance with medication needs and to provide glucometer kit.  Encouraged patient to follow up with the Clinic and to notify the clinic if he is having any issues with hypoglycemia or if glucose is consistently over 250 mg/dl as he likely needs medicaiton adjustments. Patient verbalized understanding of information discussed and he states that he has no further questions at this time related to diabetes.   RNs to provide ongoing basic DM education at bedside with this patient and engage patient to actively check blood glucose. Current CBG up to 327 mg/dl and noted patient is being discharged on Metformin 1000 mg BID. Anticipate patient will require more than Metformin for outpatient DM control.     Thanks, Barnie Alderman, RN, MSN, CDE Diabetes Coordinator Inpatient Diabetes Program [phone] (Team Pager from 8am to 5pm)

## 2020-04-18 NOTE — TOC Transition Note (Signed)
Transition of Care [facility]) - CM/SW Discharge Note   Patient Details  Name: Clinton Cole MRN: [id number] Date of Birth: January 20, 1966  [facility] CM/SW Contact:  Truitt Merle, LCSW Phone Number: 04/18/2020, 4:34 PM   Clinical Narrative:    [facility] consult received for "No insurance; new DM dx; admitted with DKA. May need insulin outpt. PCP listed as [facility]. Also, please provide glucometer kit if available."   Patient medically ready for discharge today. Substantial amount of time spent coordinating [facility] letter for patient. Met with patient and spouse at bedside. [employer] device Derrick Ravel [phone] utilized for language interpretation (Spanish). Completed assessment and provided resources for food and rental assistance. Also, provided glucometer kit and [place] letter with explanation. Patient confirmed RX to be sent to [facility] [address] [place]. Patient confirms PCP with [facility] and does have $3 for medication. Patient stated friend of wife to provide transportation to get meds and to home. Patient has transportation available for PCP appointments. Updated RN Tiffany and Dr. Reesa Chew via secure chat. Also chatted and paged Dr. Reesa Chew to updated pharmacy to [facility]. No further TOC needs.   Final next level of care: Home/Self Care Barriers to Discharge: Barriers Resolved   Patient Goals and CMS Choice Patient states their goals for this hospitalization and ongoing recovery are:: Eat better and take medicine CMS Medicare.gov Compare Post Acute Care list provided to:: Patient Choice offered to / list presented to : Patient  Discharge Placement                       Discharge Plan and Services In-house Referral: Clinical Social Work Discharge Planning Services: MATCH Program,Other - See comment (glucometer kit) Post Acute Care Choice: NA          DME Arranged: N/A DME Agency: NA       HH Arranged: NA HH Agency: NA        Social  Determinants of Health (SDOH) Interventions     Readmission Risk Interventions No flowsheet data found.

## 2020-04-20 LAB — GLUCOSE, CAPILLARY: Glucose-Capillary: 308 mg/dL — ABNORMAL HIGH (ref 70–99)

## 2020-04-20 NOTE — TOC Progression Note (Signed)
Transition of Care [facility]) - Progression Note    Patient Details  Name: Clinton Cole MRN: [id number] Date of Birth: [birth year]  Transition of Care [facility]) CM/SW [place], [place] Phone Number: [phone] 04/20/2020, 2:08 PM  Clinical Narrative:     Patient arrived at [facility] ED because he went to pick up his medications at [facility] and was unable to get them.  Patient stated he did not receive the hard copies of the prescriptions and showed this CSW the discharge summary and Match letter paperwork which he received when he was discharged on 04/18/2020.  CSW reached out to Attending and she stated she was unable to print out new prescriptions because the patient had been discharge.  TOC Supervisor called in prescription at [facility] on [address].  CSW spoke with patient and updated him and stated he would be able to pick the prescriptions at the [place].  CSW verified spoke with patient about food stamp applications, ensuring he make a follow-up appointment with his PCP and getting information from his PCP about diet changes.  Patient verbalized understanding.  Expected Discharge Plan: Home/Self Care Barriers to Discharge: Barriers Resolved  Expected Discharge Plan and Services Expected Discharge Plan: Home/Self Care In-house Referral: Clinical Social Work Discharge Planning Services: [place] - See comment (glucometer kit) Post Acute Care Choice: NA Living arrangements for the past 2 months: Apartment Expected Discharge Date: 04/18/20               DME Arranged: N/A DME Agency: NA       HH Arranged: NA HH Agency: NA         Social Determinants of Health (SDOH) Interventions    Readmission Risk Interventions No flowsheet data found.

## 2020-04-21 LAB — GLUCOSE, CAPILLARY: Glucose-Capillary: >600 mg/dL (ref 70–99)

## 2022-05-27 ENCOUNTER — Emergency Department: Payer: Self-pay

## 2022-05-27 ENCOUNTER — Emergency Department
Admission: EM | Admit: 2022-05-27 | Discharge: 2022-05-27 | Disposition: A | Discharge status: Home / Self Care | Payer: Self-pay | Attending: Emergency Medicine | Admitting: Emergency Medicine

## 2022-05-27 DIAGNOSIS — W01198A Fall on same level from slipping, tripping and stumbling with subsequent striking against other object, initial encounter: Secondary | ICD-10-CM | POA: Insufficient documentation

## 2022-05-27 DIAGNOSIS — E119 Type 2 diabetes mellitus without complications: Secondary | ICD-10-CM | POA: Insufficient documentation

## 2022-05-27 DIAGNOSIS — S52602A Unspecified fracture of lower end of left ulna, initial encounter for closed fracture: Secondary | ICD-10-CM

## 2022-05-27 DIAGNOSIS — Z23 Encounter for immunization: Secondary | ICD-10-CM | POA: Insufficient documentation

## 2022-05-27 MED ORDER — OXYCODONE-ACETAMINOPHEN 5-325 MG PO TABS: 1.0000 | ORAL_TABLET | ORAL | 0 refills | Status: AC | PRN

## 2022-05-27 MED ORDER — CEPHALEXIN 500 MG PO CAPS: 500.0000 mg | ORAL_CAPSULE | Freq: Three times a day (TID) | ORAL | 0 refills | Status: AC

## 2022-05-27 MED ORDER — OXYCODONE-ACETAMINOPHEN 5-325 MG PO TABS
1.0000 | ORAL_TABLET | Freq: Once | ORAL | Status: AC
  Administered 2022-05-27: 1 via ORAL
  Filled 2022-05-27: qty 1

## 2022-05-27 MED ORDER — TETANUS-DIPHTH-ACELL PERTUSSIS 5-2.5-18.5 LF-MCG/0.5 IM SUSY
0.5000 mL | PREFILLED_SYRINGE | Freq: Once | INTRAMUSCULAR | Status: AC
  Administered 2022-05-27: 0.5 mL via INTRAMUSCULAR
  Filled 2022-05-27: qty 0.5

## 2022-05-27 NOTE — Discharge Instructions (Addendum)
Follow up with [facility], please call for an appointment Take the medication as prescribed Keep the splint dry

## 2022-05-27 NOTE — ED Provider Notes (Signed)
[facility] Provider Note    Event Date/Time   First MD Initiated Contact with Patient 05/27/22 1405     (approximate)   History   Arm Injury   HPI  Clinton Cole is a 57 y.o. male with history of diabetes presents emergency department after a fall.  Patient was stepping off a curb and his foot hit gravel causing him to fall to the left arm.  No head injury.  No neck pain.  No other injuries reported.  Patient is unsure of his last Tdap.      Physical Exam   Triage Vital Signs: ED Triage Vitals  Enc Vitals Group     BP 05/27/22 1335 (!) 158/106     Pulse Rate 05/27/22 1335 92     Resp 05/27/22 1335 18     Temp 05/27/22 1335 98.4 F (36.9 C)     Temp Source 05/27/22 1335 Oral     SpO2 05/27/22 1335 96 %     Weight 05/27/22 1336 165 lb (74.8 kg)     Height 05/27/22 1336 5\' 7"  (1.702 m)     Head Circumference --      Peak Flow --      Pain Score 05/27/22 1335 10     Pain Loc --      Pain Edu? --      Excl. in GC? --     Most recent vital signs: Vitals:   05/27/22 1335  BP: (!) 158/106  Pulse: 92  Resp: 18  Temp: 98.4 F (36.9 C)  SpO2: 96%     General: Awake, no distress.   CV:  Good peripheral perfusion. regular rate and  rhythm Resp:  Normal effort.  Abd:  No distention.   Other:  Left forearm with abrasions noted, swelling and tenderness along the mid forearm, neurovascular is intact, good range of motion of the elbow and fingers   ED Results / Procedures / Treatments   Labs (all labs ordered are listed, but only abnormal results are displayed) Labs Reviewed - No data to display   EKG     RADIOLOGY X-ray of the left forearm    PROCEDURES:   .Ortho Injury Treatment  Date/Time: 05/27/2022 3:39 PM  Performed by: Faythe GheeFisher, [facility]el[facility] Violet W, PA-C Authorized by: Faythe GheeFisher, Rylyn Zawistowski W, PA-C   Consent:    Consent obtained:  Verbal   Consent given by:  Patient   Risks discussed:  Fracture, restricted joint movement,  vascular damage, irreducible dislocation, recurrent dislocation and stiffness   Alternatives discussed:  ReferralInjury location: forearm Location details: left forearm Injury type: fracture Fracture type: ulnar shaft Pre-procedure neurovascular assessment: neurovascularly intact Pre-procedure distal perfusion: normal Pre-procedure neurological function: normal Pre-procedure range of motion: normal Manipulation performed: no Immobilization: splint Splint type: sugar tong Splint Applied by: ED Nurse and ED Provider Supplies used: cotton padding, elastic bandage and Ortho-Glass Post-procedure neurovascular assessment: post-procedure neurovascularly intact Post-procedure distal perfusion: normal Post-procedure neurological function: normal Post-procedure range of motion: normal Comments: Sling applied      MEDICATIONS ORDERED IN ED: Medications  oxyCODONE-acetaminophen (PERCOCET/ROXICET) 5-325 MG per tablet 1 tablet (1 tablet Oral Given 05/27/22 1419)  Tdap (BOOSTRIX) injection 0.5 mL (0.5 mLs Intramuscular Given 05/27/22 1518)     IMPRESSION / MDM / ASSESSMENT AND PLAN / ED COURSE  I reviewed the triage vital signs and the nursing notes.  Differential diagnosis includes, but is not limited to, fracture, contusion, abrasions  Patient's presentation is most consistent with acute complicated illness / injury requiring diagnostic workup.   X-ray of the left forearm shows a transverse fracture of the distal ulna with 7 mm displacement.  This was independently reviewed and interpreted by me.  Consult orthopedics.  Dr. Allena Katz states to splint and follow-up with orthopedics, Dr. Rosita Kea next week.  See procedure note for splint application.  Patient was given a prescription for Percocet.  He is to apply ice to the left forearm.  Keep splint as dry as possible.  Follow-up with [facility] next week.  He is to call and make an appointment.   Patient states he understands.  He was discharged in stable condition in the care of his wife.     FINAL CLINICAL IMPRESSION(S) / ED DIAGNOSES   Final diagnoses:  Closed fracture of distal end of left ulna, unspecified fracture morphology, initial encounter     Rx / DC Orders   ED Discharge Orders          Ordered    cephALEXin (KEFLEX) 500 MG capsule  3 times daily        05/27/22 1506    oxyCODONE-acetaminophen (PERCOCET) 5-325 MG tablet  Every 4 hours PRN        05/27/22 1506             Note:  This document was prepared using Dragon voice recognition software and may include unintentional dictation errors.    Faythe Ghee, PA-C 05/27/22 1540    Pilar Jarvis, MD 05/27/22 [phone]

## 2022-05-27 NOTE — ED Triage Notes (Signed)
Pt presents to the ED due to a slip and fall on pebbles. Pt states he is now having L forearm pain and believes it may be fractured. No obvious deformity at the moment. Pt NAD, A&Ox4
